# Patient Record
Sex: Female | Born: 1980 | Race: White | Hispanic: No | Marital: Married | Smoking: Never smoker
Health system: Southern US, Community
[De-identification: ages and names within clinical notes are randomized; demographics above are authoritative.]

## PROBLEM LIST (undated history)

## (undated) ENCOUNTER — Inpatient Hospital Stay (HOSPITAL_COMMUNITY): Payer: Self-pay

## (undated) DIAGNOSIS — J45909 Unspecified asthma, uncomplicated: Secondary | ICD-10-CM

## (undated) DIAGNOSIS — F419 Anxiety disorder, unspecified: Secondary | ICD-10-CM

## (undated) HISTORY — DX: Unspecified asthma, uncomplicated: J45.909

## (undated) HISTORY — DX: Anxiety disorder, unspecified: F41.9

## (undated) HISTORY — PX: WISDOM TOOTH EXTRACTION: SHX21

---

## 2017-05-11 ENCOUNTER — Inpatient Hospital Stay (HOSPITAL_COMMUNITY)
Admission: AD | Admit: 2017-05-11 | Discharge: 2017-05-12 | Disposition: A | Discharge status: Home / Self Care | Payer: 59 | Source: Ambulatory Visit | Attending: Obstetrics and Gynecology | Admitting: Obstetrics and Gynecology

## 2017-05-11 ENCOUNTER — Encounter (HOSPITAL_COMMUNITY): Payer: Self-pay | Admitting: *Deleted

## 2017-05-11 DIAGNOSIS — Z6791 Unspecified blood type, Rh negative: Secondary | ICD-10-CM | POA: Insufficient documentation

## 2017-05-11 DIAGNOSIS — O021 Missed abortion: Secondary | ICD-10-CM | POA: Diagnosis not present

## 2017-05-11 DIAGNOSIS — O26891 Other specified pregnancy related conditions, first trimester: Secondary | ICD-10-CM

## 2017-05-11 DIAGNOSIS — Z3A08 8 weeks gestation of pregnancy: Secondary | ICD-10-CM | POA: Insufficient documentation

## 2017-05-11 DIAGNOSIS — O09891 Supervision of other high risk pregnancies, first trimester: Secondary | ICD-10-CM | POA: Diagnosis not present

## 2017-05-11 DIAGNOSIS — R109 Unspecified abdominal pain: Secondary | ICD-10-CM | POA: Diagnosis not present

## 2017-05-11 LAB — CBC
HEMATOCRIT: 30.4 % — AB (ref 36.0–46.0)
HEMOGLOBIN: 10.9 g/dL — AB (ref 12.0–15.0)
MCH: 30.3 pg (ref 26.0–34.0)
MCHC: 35.9 g/dL (ref 30.0–36.0)
MCV: 84.4 fL (ref 78.0–100.0)
Platelets: 178 10*3/uL (ref 150–400)
RBC: 3.6 MIL/uL — AB (ref 3.87–5.11)
RDW: 12 % (ref 11.5–15.5)
WBC: 9.5 10*3/uL (ref 4.0–10.5)

## 2017-05-11 LAB — TYPE AND SCREEN
ABO/RH(D): O NEG
Antibody Screen: NEGATIVE

## 2017-05-11 MED ORDER — SODIUM CHLORIDE 0.9 % IV SOLN
8.0000 mg | Freq: Once | INTRAVENOUS | Status: AC
  Administered 2017-05-11: 8 mg via INTRAVENOUS
  Filled 2017-05-11: qty 4

## 2017-05-11 MED ORDER — RHO D IMMUNE GLOBULIN 1500 UNIT/2ML IJ SOSY
300.0000 ug | PREFILLED_SYRINGE | Freq: Once | INTRAMUSCULAR | Status: AC
  Administered 2017-05-12: 300 ug via INTRAVENOUS
  Filled 2017-05-11: qty 2

## 2017-05-11 MED ORDER — MORPHINE SULFATE (PF) 4 MG/ML IV SOLN
2.0000 mg | INTRAVENOUS | Status: DC | PRN
  Administered 2017-05-11: 2 mg via INTRAVENOUS
  Filled 2017-05-11: qty 1

## 2017-05-11 MED ORDER — LACTATED RINGERS IV SOLN
INTRAVENOUS | Status: DC
  Administered 2017-05-11: 23:00:00 via INTRAVENOUS

## 2017-05-11 MED ORDER — OXYCODONE-ACETAMINOPHEN 5-325 MG PO TABS: 1.0000 | ORAL_TABLET | Freq: Four times a day (QID) | ORAL | Status: DC | PRN

## 2017-05-11 NOTE — MAU Provider Note (Signed)
History     CSN: [id number]  Arrival date and time: 05/11/17 2223   First Provider Initiated Contact with Patient 05/11/17 2250     G1 @[redacted]w[redacted]d  here with LAP. Pain started shortly after 1700. Just prior to that she had taken Cytotec for MAB. She describes pain as bilateral, sharp and intermittent. Rates 8/10. Associated sx are N/V/D. She took Ibuprofen but vomited just after. Reports small amt of VB that started around 8pm.    OB History    Gravida Para Term Preterm AB Living   1         0   SAB TAB Ectopic Multiple Live Births                  Past Medical History:  Diagnosis Date  . Medical history non-contributory     Past Surgical History:  Procedure Laterality Date  . NO PAST SURGERIES      History reviewed. No pertinent family history.  Social History  Substance Use Topics  . Smoking status: Never Smoker  . Smokeless tobacco: Never Used  . Alcohol use No    Allergies: No Known Allergies  Prescriptions Prior to Admission  Medication Sig Dispense Refill Last Dose  . Prenatal Vit-Fe Fumarate-FA (PRENATAL MULTIVITAMIN) TABS tablet Take 1 tablet by mouth daily at 12 noon.   05/11/2017 at Unknown time    Review of Systems  Gastrointestinal: Positive for abdominal pain, diarrhea, nausea and vomiting.  Genitourinary: Positive for vaginal bleeding.   Physical Exam   Blood pressure 108/65, pulse 66, temperature 98 F (36.7 C), resp. rate (!) 22, last menstrual period 03/13/2017.  Physical Exam  Nursing note and vitals reviewed. Constitutional: She is oriented to person, place, and time. She appears well-developed and well-nourished. Distressed: appears uncomfortable at times.  HENT:  Head: Normocephalic and atraumatic.  Neck: Normal range of motion.  Cardiovascular: Normal rate.   Respiratory: Effort normal. No respiratory distress.  GI: Soft. She exhibits no distension and no mass. There is no tenderness. There is no rebound and no guarding.  Genitourinary:   Genitourinary Comments: External: no lesions or erythema Vagina: rugated, pink, moist, small drk red bloody discharge, grey tissue removed from vault with ring forcep, no active bleeding from os Uterus: non enlarged, anteverted, no tender, no CMT    Musculoskeletal: Normal range of motion.  Neurological: She is alert and oriented to person, place, and time.  Skin: Skin is warm and dry.  Psychiatric: She has a normal mood and affect.   Results for orders placed or performed during the hospital encounter of 05/11/17 (from the past 24 hour(s))  CBC     Status: Abnormal   Collection Time: 05/11/17 10:54 PM  Result Value Ref Range   WBC 9.5 4.0 - 10.5 K/uL   RBC 3.60 (L) 3.87 - 5.11 MIL/uL   Hemoglobin 10.9 (L) 12.0 - 15.0 g/dL   HCT 30.4 (L) 36.0 - 46.0 %   MCV 84.4 78.0 - 100.0 fL   MCH 30.3 26.0 - 34.0 pg   MCHC 35.9 30.0 - 36.0 g/dL   RDW 12.0 11.5 - 15.5 %   Platelets 178 150 - 400 K/uL  Type and screen     Status: None   Collection Time: 05/11/17 10:54 PM  Result Value Ref Range   ABO/RH(D) O NEG    Antibody Screen NEG    Sample Expiration 05/14/2017   Rh IG workup (includes ABO/Rh)     Status: None (Preliminary result)  Collection Time: 05/11/17 10:54 PM  Result Value Ref Range   Gestational Age(Wks) 8    ABO/RH(D) O NEG    Unit Number [id number]    Blood Component Type RHIG    Unit division 00    Status of Unit ISSUED    Transfusion Status OK TO TRANSFUSE    MAU Course  Procedures Morphine Zofran Rhogam  MDM Labs ordered and reviewed. Pain and nausea improved after meds. No emesis. Bleeding minimal. Presentation, clinical findings, and plan discussed with Dr. Ouida Sills. Stable for discharge home.  Assessment and Plan   1. Missed abortion   2. Abdominal pain during pregnancy in first trimester   3. Rh negative status during pregnancy in first trimester    Discharge home Follow up in OB office in 1 week Rx Percocet Rx Zofran Bleeding/return  precautions  Allergies as of 05/12/2017   No Known Allergies     Medication List    TAKE these medications   ondansetron 4 MG disintegrating tablet Commonly known as:  ZOFRAN ODT Take 1 tablet (4 mg total) by mouth every 8 (eight) hours as needed for nausea or vomiting.   oxyCODONE-acetaminophen 5-325 MG tablet Commonly known as:  PERCOCET/ROXICET Take 1-2 tablets by mouth every 6 (six) hours as needed for severe pain.   prenatal multivitamin Tabs tablet Take 1 tablet by mouth daily at 12 noon.      Julianne Handler, CNM 05/12/2017, 12:50 AM

## 2017-05-11 NOTE — MAU Note (Addendum)
Pt seen at [facility] today by Dr Carlis Abbott and told had missed AB. Given cytotec po which she completed about 1700. Started shortly thereafter to have abd cramping, vomiting, and scant vag bleeding. Did not know pain would be this bad.

## 2017-05-12 DIAGNOSIS — O021 Missed abortion: Secondary | ICD-10-CM | POA: Diagnosis not present

## 2017-05-12 DIAGNOSIS — O09891 Supervision of other high risk pregnancies, first trimester: Secondary | ICD-10-CM | POA: Diagnosis not present

## 2017-05-12 DIAGNOSIS — R109 Unspecified abdominal pain: Secondary | ICD-10-CM | POA: Diagnosis not present

## 2017-05-12 DIAGNOSIS — Z6791 Unspecified blood type, Rh negative: Secondary | ICD-10-CM | POA: Diagnosis not present

## 2017-05-12 DIAGNOSIS — O26891 Other specified pregnancy related conditions, first trimester: Secondary | ICD-10-CM | POA: Diagnosis not present

## 2017-05-12 MED ORDER — OXYCODONE-ACETAMINOPHEN 5-325 MG PO TABS: 1.0000 | ORAL_TABLET | Freq: Four times a day (QID) | ORAL | 0 refills | Status: DC | PRN

## 2017-05-12 MED ORDER — ONDANSETRON 4 MG PO TBDP: 4.0000 mg | ORAL_TABLET | Freq: Three times a day (TID) | ORAL | 0 refills | Status: DC | PRN

## 2017-05-12 NOTE — Progress Notes (Signed)
Written and verbal d/c instructions given and understanding voiced. 

## 2017-05-12 NOTE — Discharge Instructions (Signed)

## 2017-05-13 LAB — RH IG WORKUP (INCLUDES ABO/RH)
ABO/RH(D): O NEG
Gestational Age(Wks): 8
Unit division: 0

## 2017-05-17 ENCOUNTER — Encounter (HOSPITAL_COMMUNITY): Payer: Self-pay

## 2017-05-18 ENCOUNTER — Encounter (HOSPITAL_COMMUNITY): Payer: Self-pay | Admitting: *Deleted

## 2017-05-18 ENCOUNTER — Encounter (HOSPITAL_COMMUNITY)
Admission: AD | Disposition: A | Discharge status: Home / Self Care | Payer: Self-pay | Source: Ambulatory Visit | Attending: Obstetrics

## 2017-05-18 ENCOUNTER — Ambulatory Visit (HOSPITAL_COMMUNITY): Payer: 59

## 2017-05-18 ENCOUNTER — Ambulatory Visit (HOSPITAL_COMMUNITY)
Admission: AD | Admit: 2017-05-18 | Discharge: 2017-05-18 | Disposition: A | Discharge status: Home / Self Care | Payer: 59 | Source: Ambulatory Visit | Attending: Obstetrics | Admitting: Obstetrics

## 2017-05-18 ENCOUNTER — Ambulatory Visit (HOSPITAL_COMMUNITY): Payer: 59 | Admitting: Anesthesiology

## 2017-05-18 DIAGNOSIS — O020 Blighted ovum and nonhydatidiform mole: Secondary | ICD-10-CM | POA: Insufficient documentation

## 2017-05-18 DIAGNOSIS — Z3A09 9 weeks gestation of pregnancy: Secondary | ICD-10-CM | POA: Diagnosis not present

## 2017-05-18 DIAGNOSIS — R58 Hemorrhage, not elsewhere classified: Secondary | ICD-10-CM

## 2017-05-18 HISTORY — PX: DILATION AND EVACUATION: SHX1459

## 2017-05-18 HISTORY — PX: HYSTEROSCOPY: SHX211

## 2017-05-18 LAB — CBC
HCT: 35.4 % — ABNORMAL LOW (ref 36.0–46.0)
Hemoglobin: 12.2 g/dL (ref 12.0–15.0)
MCH: 29.8 pg (ref 26.0–34.0)
MCHC: 34.5 g/dL (ref 30.0–36.0)
MCV: 86.3 fL (ref 78.0–100.0)
PLATELETS: 196 10*3/uL (ref 150–400)
RBC: 4.1 MIL/uL (ref 3.87–5.11)
RDW: 12.2 % (ref 11.5–15.5)
WBC: 6.4 10*3/uL (ref 4.0–10.5)

## 2017-05-18 LAB — COMPREHENSIVE METABOLIC PANEL
ALT: 19 U/L (ref 14–54)
ANION GAP: 9 (ref 5–15)
AST: 22 U/L (ref 15–41)
Albumin: 4.6 g/dL (ref 3.5–5.0)
Alkaline Phosphatase: 36 U/L — ABNORMAL LOW (ref 38–126)
BUN: 9 mg/dL (ref 6–20)
CHLORIDE: 103 mmol/L (ref 101–111)
CO2: 23 mmol/L (ref 22–32)
Calcium: 9.4 mg/dL (ref 8.9–10.3)
Creatinine, Ser: 0.58 mg/dL (ref 0.44–1.00)
GFR calc non Af Amer: >60 mL/min (ref >60)
Glucose, Bld: 89 mg/dL (ref 65–99)
POTASSIUM: 3.9 mmol/L (ref 3.5–5.1)
SODIUM: 135 mmol/L (ref 135–145)
Total Bilirubin: 1.5 mg/dL — ABNORMAL HIGH (ref 0.3–1.2)
Total Protein: 7.7 g/dL (ref 6.5–8.1)

## 2017-05-18 LAB — PREPARE RBC (CROSSMATCH)

## 2017-05-18 LAB — HCG, QUANTITATIVE, PREGNANCY: hCG, Beta Chain, Quant, S: 427592 m[IU]/mL — ABNORMAL HIGH (ref <5)

## 2017-05-18 SURGERY — DILATION AND EVACUATION, UTERUS
Anesthesia: Monitor Anesthesia Care | Site: Vagina

## 2017-05-18 MED ORDER — SCOPOLAMINE 1 MG/3DAYS TD PT72
1.0000 | MEDICATED_PATCH | Freq: Once | TRANSDERMAL | Status: DC
  Administered 2017-05-18: 1.5 mg via TRANSDERMAL

## 2017-05-18 MED ORDER — DEXAMETHASONE SODIUM PHOSPHATE 10 MG/ML IJ SOLN
INTRAMUSCULAR | Status: AC
  Filled 2017-05-18: qty 1

## 2017-05-18 MED ORDER — IBUPROFEN 800 MG PO TABS: 800.0000 mg | ORAL_TABLET | Freq: Four times a day (QID) | ORAL | 0 refills | Status: DC | PRN

## 2017-05-18 MED ORDER — FENTANYL CITRATE (PF) 100 MCG/2ML IJ SOLN
25.0000 ug | INTRAMUSCULAR | Status: DC | PRN
  Administered 2017-05-18 (×2): 50 ug via INTRAVENOUS

## 2017-05-18 MED ORDER — LIDOCAINE HCL 1 % IJ SOLN
INTRAMUSCULAR | Status: AC
  Filled 2017-05-18: qty 20

## 2017-05-18 MED ORDER — PROPOFOL 10 MG/ML IV BOLUS
INTRAVENOUS | Status: AC
  Filled 2017-05-18: qty 20

## 2017-05-18 MED ORDER — METHYLERGONOVINE MALEATE 0.2 MG PO TABS: 0.2000 mg | ORAL_TABLET | Freq: Four times a day (QID) | ORAL | 0 refills | Status: AC

## 2017-05-18 MED ORDER — LIDOCAINE HCL 1 % IJ SOLN
INTRAMUSCULAR | Status: DC | PRN
  Administered 2017-05-18: 10 mL

## 2017-05-18 MED ORDER — LIDOCAINE HCL (CARDIAC) 20 MG/ML IV SOLN
INTRAVENOUS | Status: AC
  Filled 2017-05-18: qty 5

## 2017-05-18 MED ORDER — MIDAZOLAM HCL 2 MG/2ML IJ SOLN
INTRAMUSCULAR | Status: DC | PRN
  Administered 2017-05-18: 2 mg via INTRAVENOUS

## 2017-05-18 MED ORDER — LIDOCAINE HCL (CARDIAC) 20 MG/ML IV SOLN
INTRAVENOUS | Status: DC | PRN
Start: 2017-05-18 — End: 2017-05-18
  Administered 2017-05-18: 80 mg via INTRAVENOUS

## 2017-05-18 MED ORDER — SCOPOLAMINE 1 MG/3DAYS TD PT72
MEDICATED_PATCH | TRANSDERMAL | Status: AC
  Filled 2017-05-18: qty 1

## 2017-05-18 MED ORDER — FENTANYL CITRATE (PF) 100 MCG/2ML IJ SOLN
INTRAMUSCULAR | Status: DC | PRN
  Administered 2017-05-18: 100 ug via INTRAVENOUS
  Administered 2017-05-18 (×2): 50 ug via INTRAVENOUS

## 2017-05-18 MED ORDER — PROPOFOL 10 MG/ML IV BOLUS
INTRAVENOUS | Status: DC | PRN
  Administered 2017-05-18 (×2): 20 mg via INTRAVENOUS
  Administered 2017-05-18 (×2): 30 mg via INTRAVENOUS
  Administered 2017-05-18: 20 mg via INTRAVENOUS
  Administered 2017-05-18: 10 mg via INTRAVENOUS
  Administered 2017-05-18: 20 mg via INTRAVENOUS
  Administered 2017-05-18: 50 mg via INTRAVENOUS
  Administered 2017-05-18: 10 mg via INTRAVENOUS
  Administered 2017-05-18 (×4): 20 mg via INTRAVENOUS
  Administered 2017-05-18 (×3): 30 mg via INTRAVENOUS

## 2017-05-18 MED ORDER — DEXAMETHASONE SODIUM PHOSPHATE 10 MG/ML IJ SOLN
INTRAMUSCULAR | Status: DC | PRN
  Administered 2017-05-18: 10 mg via INTRAVENOUS

## 2017-05-18 MED ORDER — ONDANSETRON HCL 4 MG/2ML IJ SOLN
INTRAMUSCULAR | Status: AC
  Filled 2017-05-18: qty 2

## 2017-05-18 MED ORDER — ONDANSETRON HCL 4 MG/2ML IJ SOLN
INTRAMUSCULAR | Status: DC | PRN
  Administered 2017-05-18: 4 mg via INTRAVENOUS

## 2017-05-18 MED ORDER — LACTATED RINGERS IV SOLN
INTRAVENOUS | Status: DC
  Administered 2017-05-18 (×2): via INTRAVENOUS

## 2017-05-18 MED ORDER — KETOROLAC TROMETHAMINE 30 MG/ML IJ SOLN
INTRAMUSCULAR | Status: DC | PRN
  Administered 2017-05-18: 30 mg via INTRAVENOUS

## 2017-05-18 MED ORDER — METHYLERGONOVINE MALEATE 0.2 MG/ML IJ SOLN
INTRAMUSCULAR | Status: DC | PRN
  Administered 2017-05-18: 0.2 mg via INTRAMUSCULAR

## 2017-05-18 MED ORDER — LACTATED RINGERS IV SOLN
INTRAVENOUS | Status: DC
  Administered 2017-05-18: 11:00:00 via INTRAVENOUS

## 2017-05-18 MED ORDER — MIDAZOLAM HCL 2 MG/2ML IJ SOLN
INTRAMUSCULAR | Status: AC
  Filled 2017-05-18: qty 2

## 2017-05-18 MED ORDER — FENTANYL CITRATE (PF) 250 MCG/5ML IJ SOLN
INTRAMUSCULAR | Status: AC
  Filled 2017-05-18: qty 5

## 2017-05-18 MED ORDER — PROMETHAZINE HCL 25 MG/ML IJ SOLN: 6.2500 mg | INTRAMUSCULAR | Status: DC | PRN

## 2017-05-18 MED ORDER — FENTANYL CITRATE (PF) 100 MCG/2ML IJ SOLN
INTRAMUSCULAR | Status: AC
  Filled 2017-05-18: qty 2

## 2017-05-18 MED ORDER — KETOROLAC TROMETHAMINE 30 MG/ML IJ SOLN: 30.0000 mg | Freq: Once | INTRAMUSCULAR | Status: DC | PRN

## 2017-05-18 MED ORDER — HYDROCODONE-ACETAMINOPHEN 7.5-325 MG PO TABS: 1.0000 | ORAL_TABLET | Freq: Once | ORAL | Status: DC | PRN

## 2017-05-18 SURGICAL SUPPLY — 24 items
CATH ROBINSON RED A/P 16FR (CATHETERS) ×3 IMPLANT
CLOTH BEACON ORANGE TIMEOUT ST (SAFETY) ×3 IMPLANT
CONT PATH 16OZ SNAP LID 3702 (MISCELLANEOUS) ×3 IMPLANT
DECANTER SPIKE VIAL GLASS SM (MISCELLANEOUS) ×3 IMPLANT
DILATOR CANAL MILEX (MISCELLANEOUS) IMPLANT
GLOVE BIOGEL PI IND STRL 6.5 (GLOVE) ×1 IMPLANT
GLOVE BIOGEL PI IND STRL 7.0 (GLOVE) ×1 IMPLANT
GLOVE BIOGEL PI INDICATOR 6.5 (GLOVE) ×2
GLOVE BIOGEL PI INDICATOR 7.0 (GLOVE) ×2
GLOVE ECLIPSE 6.0 STRL STRAW (GLOVE) ×3 IMPLANT
GOWN STRL REUS W/TWL LRG LVL3 (GOWN DISPOSABLE) ×6 IMPLANT
KIT BERKELEY 1ST TRIMESTER 3/8 (MISCELLANEOUS) ×3 IMPLANT
NS IRRIG 1000ML POUR BTL (IV SOLUTION) ×3 IMPLANT
PACK VAGINAL MINOR WOMEN LF (CUSTOM PROCEDURE TRAY) ×3 IMPLANT
PAD OB MATERNITY 4.3X12.25 (PERSONAL CARE ITEMS) ×3 IMPLANT
PAD PREP 24X48 CUFFED NSTRL (MISCELLANEOUS) ×3 IMPLANT
SET BERKELEY SUCTION TUBING (SUCTIONS) ×3 IMPLANT
TOWEL OR 17X24 6PK STRL BLUE (TOWEL DISPOSABLE) ×6 IMPLANT
TUBING AQUILEX INFLOW (TUBING) ×3 IMPLANT
TUBING AQUILEX OUTFLOW (TUBING) ×3 IMPLANT
VACURETTE 10 RIGID CVD (CANNULA) ×3 IMPLANT
VACURETTE 7MM CVD STRL WRAP (CANNULA) ×3 IMPLANT
VACURETTE 8 RIGID CVD (CANNULA) IMPLANT
VACURETTE 9 RIGID CVD (CANNULA) ×3 IMPLANT

## 2017-05-18 NOTE — Anesthesia Preprocedure Evaluation (Addendum)
Anesthesia Evaluation  Patient identified by MRN, date of birth, ID band Patient awake    Reviewed: Allergy & Precautions, NPO status , Patient's Chart, lab work & pertinent test results  Airway Mallampati: II  TM Distance: >3 FB     Dental   Pulmonary neg pulmonary ROS,    breath sounds clear to auscultation       Cardiovascular negative cardio ROS   Rhythm:Regular Rate:Normal     Neuro/Psych negative neurological ROS     GI/Hepatic negative GI ROS, Neg liver ROS,   Endo/Other  negative endocrine ROS  Renal/GU negative Renal ROS     Musculoskeletal   Abdominal   Peds  Hematology negative hematology ROS (+)   Anesthesia Other Findings   Reproductive/Obstetrics Molar pregnancy for D&E                             Lab Results  Component Value Date   WBC 6.4 05/18/2017   HGB 12.2 05/18/2017   HCT 35.4 (L) 05/18/2017   MCV 86.3 05/18/2017   PLT 196 05/18/2017   Lab Results  Component Value Date   CREATININE 0.58 05/18/2017   BUN 9 05/18/2017   NA 135 05/18/2017   K 3.9 05/18/2017   CL 103 05/18/2017   CO2 23 05/18/2017    Anesthesia Physical Anesthesia Plan  ASA: II  Anesthesia Plan: MAC   Post-op Pain Management:    Induction: Intravenous  PONV Risk Score and Plan: 3 and Ondansetron, Midazolam, Propofol infusion, Scopolamine patch - Pre-op and Treatment may vary due to age or medical condition  Airway Management Planned: Natural Airway and Simple Face Mask  Additional Equipment:   Intra-op Plan:   Post-operative Plan:   Informed Consent: I have reviewed the patients History and Physical, chart, labs and discussed the procedure including the risks, benefits and alternatives for the proposed anesthesia with the patient or authorized representative who has indicated his/her understanding and acceptance.     Plan Discussed with: CRNA  Anesthesia Plan Comments:         Anesthesia Quick Evaluation

## 2017-05-18 NOTE — Anesthesia Postprocedure Evaluation (Signed)
Anesthesia Post Note  Patient: Jasmine Watts  Procedure(s) Performed: Procedure(s) (LRB): DILATATION AND EVACUATION under ULTRASOUND GUIDANCE (N/A) HYSTEROSCOPY (N/A)     Patient location during evaluation: PACU Anesthesia Type: MAC Level of consciousness: awake and alert Pain management: pain level controlled Vital Signs Assessment: post-procedure vital signs reviewed and stable Respiratory status: spontaneous breathing, nonlabored ventilation, respiratory function stable and patient connected to nasal cannula oxygen Cardiovascular status: stable and blood pressure returned to baseline Anesthetic complications: no    Last Vitals:  Vitals:   05/18/17 1500 05/18/17 1515  BP: 100/61 (!) 105/51  Pulse: 62 63  Resp: 13 13  Temp:    SpO2: 100% 100%    Last Pain:  Vitals:   05/18/17 1515  TempSrc:   PainSc: Asleep   Pain Goal: Patients Stated Pain Goal: 3 (05/18/17 1515)               [name]

## 2017-05-18 NOTE — Op Note (Signed)
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    Specimen: products of conception  EBL: 250 cc    After adequate anesthesia was achieved, the patient placed in the dorsal lithotomy position in [place] stirrups.  She was prepped and draped in the usual sterile fashion.  The bivalve speculum was placed in the vagina and the anterior lip of the cervix grasped with a single-tooth tenaculum.   10cc of 1% lidocaine was administered in a paracervical fashion. The cervix was serially dilated under ultrasound guidance to 24F.  The 10 mm curette did not easily navigate the internal cervical os, so a 9 mm suction curette was advanced to the fundus, the vacuum was engaged, and multiple suction passes were performed until the products of conception were evacuated.  A Sharp curettage was performed and a gritty texture was noted in all four quadrants.  However, ultrasound noted a small pocket of what appeared to be continued products of conception at the left fundus.  This area of the uterus was unable to be accessed with either the suction or sharp curettage.   Assistance was requested and Dr. Harrington Challenger scrubbed for the remainder of the case.  She also made multiple attempts under ultrasound guidance to access the area of suspected products, but was unable to do so.   At this time, ultrasound showed no free fluid in the pelvis. Decision was made to proceed with hysteroscopy.  The hysteroscope was passed through the cervix under direct visualization.  The cervical canal was normal.  There was no false passage.  The endometrium was surveyed and was normal.  The cavity appeared to have a normal shape.  There was no  uterine perforation seen.  There was no area of possible products of conception.  There was no evidence of connection to a separate uterine horn.  However, under ultrasound guidance, the hysteroscope was never able to advance to the area at the left fundus that corresponded to the suspected products.  At this time, the decision was made to end the procedure.  A bimanual massage was performed and confirmed good uterine tone.  A dose of 0.2mg  of IM methergine was given for bleeding prophylaxis. All instruments were removed from the vagina.  Pressure was used to achieve hemostasis at the tenaculum site. The patient tolerated the procedure well was brought to the recovery room in stable condition for the procedure. All sponge and needle counts correct x2.  The patient will follow up in 1 week for repeat quantitative beta hcg and ultrasound.    [place], [facility]

## 2017-05-18 NOTE — Progress Notes (Signed)
Patient Has large bruise on her left arm/elbow area.  Patient states the bruise if from "down on floor, beating arm on floor from pain.  Patient also has a 2x2 taped to her lower back from where dermatologist removal of skin tag.  2x2 covered with paper tape, clean, dry and intact.

## 2017-05-18 NOTE — Discharge Instructions (Addendum)
Pelvic rest x 2 weeks (no intercourse or tampons).  No tub baths or swimming for two weeks.    Do not drive until you are not taking narcotic pain medication.   Call your doctor if you have heavy vaginal bleeding (soaking through a pad an hour or more for >2 hours in a row), temperature >101F, severe nausea, vomiting, severe or worsening abdominal pain, dizziness, shortness of breath, chest pain or any other concerns.  Please take motrin every 6 hours.  Add percocet if your pain is not controlled by motrin alone. You have been prescribed an methergine to help with bleeding.  Please take the first dose tonight at 7pm and an additional dose every 6 hours for 4 doses.   Post Anesthesia Home Care Instructions NO IBUPROFEN PRODUCTS UNTIL: 7:45 PM TONIGHT  Activity: Get plenty of rest for the remainder of the day. A responsible individual must stay with you for 24 hours following the procedure.  For the next 24 hours, DO NOT: -Drive a car -Paediatric nurse -Drink alcoholic beverages -Take any medication unless instructed by your physician -Make any legal decisions or sign important papers.  Meals: Start with liquid foods such as gelatin or soup. Progress to regular foods as tolerated. Avoid greasy, spicy, heavy foods. If nausea and/or vomiting occur, drink only clear liquids until the nausea and/or vomiting subsides. Call your physician if vomiting continues.  Special Instructions/Symptoms: Your throat may feel dry or sore from the anesthesia or the breathing tube placed in your throat during surgery. If this causes discomfort, gargle with warm salt water. The discomfort should disappear within 24 hours.  If you had a scopolamine patch placed behind your ear for the management of post- operative nausea and/or vomiting:  1. The medication in the patch is effective for 72 hours, after which it should be removed.  Wrap patch in a tissue and discard in the trash. Wash hands thoroughly with soap  and water. 2. You may remove the patch earlier than 72 hours if you experience unpleasant side effects which may include dry mouth, dizziness or visual disturbances. 3. Avoid touching the patch. Wash your hands with soap and water after contact with the patch.

## 2017-05-18 NOTE — H&P (Signed)
36 y.o. G1P0 @ [redacted]w[redacted]d by LMP presents for [facility] for molar pregnancy.  She had an an early failed pregnancy (GS, possible yolk sac but no development of fetal pole.).  She elected for medical management and took 800ug of cytotec on 8/17.  She was seen in MAU on 8/18 for pain and bleeding.  She was noted on exam to have POC at the external cervical os.  This was collected and sent for pathology and results returned complete molar pregnancy.  She was seen in the office yesterday and noted to have a thickened endometrial stripe at 2.8 cm consistent with retained POC.  The most recent US had the characteristic appearance of a molar pregnancy.    Past Medical History:  Diagnosis Date  . Medical history non-contributory     Past Surgical History:  Procedure Laterality Date  . NO PAST SURGERIES      OB History  Gravida Para Term Preterm AB Living  1         0  SAB TAB Ectopic Multiple Live Births               # Outcome Date GA Lbr Len/2nd Weight Sex Delivery Anes PTL Lv  1 Current               Social History   Social History  . Marital status: Married    Spouse name: N/A  . Number of children: N/A  . Years of education: N/A   Occupational History  . Not on file.   Social History Main Topics  . Smoking status: Never Smoker  . Smokeless tobacco: Never Used  . Alcohol use No  . Drug use: No  . Sexual activity: Yes   Other Topics Concern  . Not on file   Social History Narrative  . No narrative on file   Patient has no known allergies.   Other PNC: uncomplicated.  ABO, Rh: --/--/O NEG, O NEG (08/17 2254) Antibody: NEG (08/17 2254)   General:  NAD Abdomen:  soft Ex:  no edema FHTs:  Absent   A/P   36 y.o.  G1P0  [redacted]w[redacted]d presents for dilation and curettage for suspected molar pregnancy.    Plan for surgical management.  Discussed risks to include infection, increased risk of bleeding, damage to surrounding structures (including but not limited to vagina, cervix, bladder,  uterus), uterine perforation, need for additional procedures.  Given molar pregnancy, will get preop quant bhcg, cbc, cmp, cxr.  Will have 2 IVs and T&C for 2 units.  Discussed need to follow quants to 0 and for 6 months.  Discussed risks of GTD/GTN.  All questions answered and patient elects to proceed.  Rh: Neg--pt received rhogam on 8/17.  [place]

## 2017-05-18 NOTE — Transfer of Care (Signed)
Immediate Anesthesia Transfer of Care Note  Patient: Jasmine Watts  Procedure(s) Performed: Procedure(s): DILATATION AND EVACUATION under ULTRASOUND GUIDANCE (N/A) HYSTEROSCOPY (N/A)  Patient Location: PACU  Anesthesia Type:MAC  Level of Consciousness: sedated  Airway & Oxygen Therapy: Patient Spontanous Breathing and Patient connected to nasal cannula oxygen  Post-op Assessment: Report given to RN  Post vital signs: Reviewed and stable  Last Vitals:  Vitals:   05/18/17 1022  BP: (!) 135/59  Pulse: 77  Resp: 18  Temp: 37.1 C  SpO2: 100%    Last Pain:  Vitals:   05/18/17 1022  TempSrc: Oral      Patients Stated Pain Goal: 3 (09/81/19 1478)  Complications: No apparent anesthesia complications

## 2017-05-19 ENCOUNTER — Encounter (HOSPITAL_COMMUNITY): Payer: Self-pay | Admitting: Obstetrics

## 2017-05-22 LAB — TYPE AND SCREEN
ABO/RH(D): O NEG
Antibody Screen: POSITIVE
DAT, IGG: NEGATIVE
UNIT DIVISION: 0
Unit division: 0

## 2017-05-22 LAB — BPAM RBC
Blood Product Expiration Date: 201809252359
Blood Product Expiration Date: 201809272359
UNIT TYPE AND RH: 9500
UNIT TYPE AND RH: 9500

## 2017-07-19 ENCOUNTER — Encounter (HOSPITAL_COMMUNITY): Payer: Self-pay | Admitting: *Deleted

## 2017-07-23 ENCOUNTER — Ambulatory Visit (HOSPITAL_COMMUNITY)
Admission: RE | Admit: 2017-07-23 | Discharge: 2017-07-23 | Disposition: A | Discharge status: Home / Self Care | Payer: 59 | Source: Ambulatory Visit | Attending: Obstetrics | Admitting: Obstetrics

## 2017-07-23 ENCOUNTER — Encounter (HOSPITAL_COMMUNITY)
Admission: RE | Disposition: A | Discharge status: Home / Self Care | Payer: Self-pay | Source: Ambulatory Visit | Attending: Obstetrics

## 2017-07-23 ENCOUNTER — Encounter (HOSPITAL_COMMUNITY): Payer: Self-pay

## 2017-07-23 ENCOUNTER — Ambulatory Visit (HOSPITAL_COMMUNITY): Payer: 59 | Admitting: Anesthesiology

## 2017-07-23 ENCOUNTER — Ambulatory Visit (HOSPITAL_COMMUNITY): Payer: 59

## 2017-07-23 DIAGNOSIS — Z3A00 Weeks of gestation of pregnancy not specified: Secondary | ICD-10-CM | POA: Diagnosis not present

## 2017-07-23 DIAGNOSIS — O020 Blighted ovum and nonhydatidiform mole: Secondary | ICD-10-CM | POA: Insufficient documentation

## 2017-07-23 DIAGNOSIS — R58 Hemorrhage, not elsewhere classified: Secondary | ICD-10-CM

## 2017-07-23 DIAGNOSIS — Z791 Long term (current) use of non-steroidal anti-inflammatories (NSAID): Secondary | ICD-10-CM | POA: Diagnosis not present

## 2017-07-23 HISTORY — PX: DILATION AND EVACUATION: SHX1459

## 2017-07-23 LAB — CBC
HCT: 39 % (ref 36.0–46.0)
Hemoglobin: 13 g/dL (ref 12.0–15.0)
MCH: 29.7 pg (ref 26.0–34.0)
MCHC: 33.3 g/dL (ref 30.0–36.0)
MCV: 89 fL (ref 78.0–100.0)
PLATELETS: 214 10*3/uL (ref 150–400)
RBC: 4.38 MIL/uL (ref 3.87–5.11)
RDW: 13.3 % (ref 11.5–15.5)
WBC: 5.5 10*3/uL (ref 4.0–10.5)

## 2017-07-23 LAB — HCG, QUANTITATIVE, PREGNANCY: HCG, BETA CHAIN, QUANT, S: 292 m[IU]/mL — AB (ref <5)

## 2017-07-23 SURGERY — DILATION AND EVACUATION, UTERUS
Anesthesia: Monitor Anesthesia Care | Site: Vagina

## 2017-07-23 MED ORDER — LACTATED RINGERS IV SOLN
INTRAVENOUS | Status: DC
  Administered 2017-07-23: 125 mL/h via INTRAVENOUS

## 2017-07-23 MED ORDER — PROPOFOL 500 MG/50ML IV EMUL
INTRAVENOUS | Status: DC | PRN
  Administered 2017-07-23: 100 ug/kg/min via INTRAVENOUS

## 2017-07-23 MED ORDER — ONDANSETRON HCL 4 MG/2ML IJ SOLN
INTRAMUSCULAR | Status: AC
  Filled 2017-07-23: qty 2

## 2017-07-23 MED ORDER — LIDOCAINE HCL 1 % IJ SOLN
INTRAMUSCULAR | Status: AC
  Filled 2017-07-23: qty 1

## 2017-07-23 MED ORDER — MEPERIDINE HCL 25 MG/ML IJ SOLN: 6.2500 mg | INTRAMUSCULAR | Status: DC | PRN

## 2017-07-23 MED ORDER — DOXYCYCLINE HYCLATE 100 MG IV SOLR
200.0000 mg | Freq: Once | INTRAVENOUS | Status: AC
  Administered 2017-07-23: 200 mg via INTRAVENOUS
  Filled 2017-07-23: qty 200

## 2017-07-23 MED ORDER — MIDAZOLAM HCL 2 MG/2ML IJ SOLN: 0.5000 mg | Freq: Once | INTRAMUSCULAR | Status: DC | PRN

## 2017-07-23 MED ORDER — DEXAMETHASONE SODIUM PHOSPHATE 10 MG/ML IJ SOLN
INTRAMUSCULAR | Status: AC
  Filled 2017-07-23: qty 1

## 2017-07-23 MED ORDER — LIDOCAINE HCL (CARDIAC) 20 MG/ML IV SOLN
INTRAVENOUS | Status: AC
  Filled 2017-07-23: qty 5

## 2017-07-23 MED ORDER — LIDOCAINE HCL (CARDIAC) 20 MG/ML IV SOLN
INTRAVENOUS | Status: DC | PRN
  Administered 2017-07-23: 40 mg via INTRAVENOUS

## 2017-07-23 MED ORDER — ONDANSETRON HCL 4 MG/2ML IJ SOLN
INTRAMUSCULAR | Status: DC | PRN
  Administered 2017-07-23: 4 mg via INTRAVENOUS

## 2017-07-23 MED ORDER — MIDAZOLAM HCL 2 MG/2ML IJ SOLN
INTRAMUSCULAR | Status: AC
  Filled 2017-07-23: qty 2

## 2017-07-23 MED ORDER — FENTANYL CITRATE (PF) 100 MCG/2ML IJ SOLN
INTRAMUSCULAR | Status: AC
  Filled 2017-07-23: qty 2

## 2017-07-23 MED ORDER — PROPOFOL 10 MG/ML IV BOLUS
INTRAVENOUS | Status: AC
  Filled 2017-07-23: qty 20

## 2017-07-23 MED ORDER — KETOROLAC TROMETHAMINE 30 MG/ML IJ SOLN
INTRAMUSCULAR | Status: DC | PRN
  Administered 2017-07-23: 30 mg via INTRAVENOUS

## 2017-07-23 MED ORDER — FENTANYL CITRATE (PF) 100 MCG/2ML IJ SOLN: 25.0000 ug | INTRAMUSCULAR | Status: DC | PRN

## 2017-07-23 MED ORDER — PROMETHAZINE HCL 25 MG/ML IJ SOLN: 6.2500 mg | INTRAMUSCULAR | Status: DC | PRN

## 2017-07-23 MED ORDER — FENTANYL CITRATE (PF) 100 MCG/2ML IJ SOLN
INTRAMUSCULAR | Status: DC | PRN
  Administered 2017-07-23 (×2): 50 ug via INTRAVENOUS

## 2017-07-23 MED ORDER — LIDOCAINE HCL 1 % IJ SOLN
INTRAMUSCULAR | Status: DC | PRN
  Administered 2017-07-23: 10 mL

## 2017-07-23 MED ORDER — SCOPOLAMINE 1 MG/3DAYS TD PT72
MEDICATED_PATCH | TRANSDERMAL | Status: AC
  Administered 2017-07-23: 1.5 mg via TRANSDERMAL
  Filled 2017-07-23: qty 1

## 2017-07-23 MED ORDER — GLYCOPYRROLATE 0.2 MG/ML IJ SOLN
INTRAMUSCULAR | Status: AC
  Filled 2017-07-23: qty 1

## 2017-07-23 MED ORDER — LACTATED RINGERS IV SOLN: INTRAVENOUS | Status: DC

## 2017-07-23 MED ORDER — DEXAMETHASONE SODIUM PHOSPHATE 10 MG/ML IJ SOLN
INTRAMUSCULAR | Status: DC | PRN
  Administered 2017-07-23: 5 mg via INTRAVENOUS

## 2017-07-23 MED ORDER — PROPOFOL 10 MG/ML IV BOLUS
INTRAVENOUS | Status: DC | PRN
  Administered 2017-07-23: 50 mg via INTRAVENOUS

## 2017-07-23 MED ORDER — MIDAZOLAM HCL 2 MG/2ML IJ SOLN
INTRAMUSCULAR | Status: DC | PRN
  Administered 2017-07-23: 2 mg via INTRAVENOUS

## 2017-07-23 MED ORDER — SCOPOLAMINE 1 MG/3DAYS TD PT72
1.0000 | MEDICATED_PATCH | Freq: Once | TRANSDERMAL | Status: DC
  Administered 2017-07-23: 1.5 mg via TRANSDERMAL

## 2017-07-23 MED ORDER — PROPOFOL 500 MG/50ML IV EMUL: INTRAVENOUS | Status: DC | PRN

## 2017-07-23 SURGICAL SUPPLY — 23 items
CATH ROBINSON RED A/P 16FR (CATHETERS) ×6 IMPLANT
CONT PATH 16OZ SNAP LID 3702 (MISCELLANEOUS) IMPLANT
DECANTER SPIKE VIAL GLASS SM (MISCELLANEOUS) ×3 IMPLANT
DILATOR CANAL MILEX (MISCELLANEOUS) ×3 IMPLANT
GLOVE BIOGEL PI IND STRL 6.5 (GLOVE) ×1 IMPLANT
GLOVE BIOGEL PI IND STRL 7.0 (GLOVE) ×1 IMPLANT
GLOVE BIOGEL PI INDICATOR 6.5 (GLOVE) ×2
GLOVE BIOGEL PI INDICATOR 7.0 (GLOVE) ×2
GLOVE ECLIPSE 6.0 STRL STRAW (GLOVE) ×3 IMPLANT
GOWN STRL REUS W/TWL LRG LVL3 (GOWN DISPOSABLE) ×6 IMPLANT
KIT BERKELEY 1ST TRIMESTER 3/8 (MISCELLANEOUS) ×3 IMPLANT
NS IRRIG 1000ML POUR BTL (IV SOLUTION) ×3 IMPLANT
PACK VAGINAL MINOR WOMEN LF (CUSTOM PROCEDURE TRAY) ×3 IMPLANT
PAD OB MATERNITY 4.3X12.25 (PERSONAL CARE ITEMS) ×3 IMPLANT
PAD PREP 24X48 CUFFED NSTRL (MISCELLANEOUS) ×3 IMPLANT
SET BERKELEY SUCTION TUBING (SUCTIONS) ×3 IMPLANT
SYR BULB IRRIGATION 50ML (SYRINGE) ×3 IMPLANT
TOWEL OR 17X24 6PK STRL BLUE (TOWEL DISPOSABLE) ×6 IMPLANT
VACURETTE 10 RIGID CVD (CANNULA) IMPLANT
VACURETTE 6 ASPIR F TIP BERK (CANNULA) ×3 IMPLANT
VACURETTE 7MM CVD STRL WRAP (CANNULA) ×3 IMPLANT
VACURETTE 8 RIGID CVD (CANNULA) IMPLANT
VACURETTE 9 RIGID CVD (CANNULA) IMPLANT

## 2017-07-23 NOTE — H&P (Signed)
36 y.o. G1P0010 presents for repeat D&C for complete molar pregnancy.  She had a D&C on 8/24 for complete molar pregnancy under US guidance.  Initial bhcg was 427k.  Intraoperatively, there was an area at the fundus that was concerning for retained products, but this area could not be accessed with the suction.  Hysteroscopy was performed and no evidence of retained products could be visualized.  Following the procedure, her bhcg rapidly dropped to < 1000.  They trended downward until the last few weeks, when the bhcg levels hit a plateau, and more recently increased from 344 to 391.  In consultation with gyn oncology, Dr. Denman George, then plan was made for a repeat D&C.    Past Medical History:  Diagnosis Date  . Medical history non-contributory     Past Surgical History:  Procedure Laterality Date  . DILATION AND EVACUATION N/A 05/18/2017   Procedure: DILATATION AND EVACUATION under ULTRASOUND GUIDANCE;  Surgeon: Jerelyn Charles, MD;  Location: [facility];  Service: Gynecology;  Laterality: N/A;  . HYSTEROSCOPY N/A 05/18/2017   Procedure: HYSTEROSCOPY;  Surgeon: Jerelyn Charles, MD;  Location: [facility];  Service: Gynecology;  Laterality: N/A;  . WISDOM TOOTH EXTRACTION      OB History  Gravida Para Term Preterm AB Living  1         0  SAB TAB Ectopic Multiple Live Births               # Outcome Date GA Lbr Len/2nd Weight Sex Delivery Anes PTL Lv  1 Current               Social History   Social History  . Marital status: Married    Spouse name: N/A  . Number of children: N/A  . Years of education: N/A   Occupational History  . Not on file.   Social History Main Topics  . Smoking status: Never Smoker  . Smokeless tobacco: Never Used  . Alcohol use 6.0 oz/week    10 Glasses of wine per week  . Drug use: No  . Sexual activity: Yes    Birth control/ protection: None     Comment: retained products after D&E in 04/2017   Other Topics Concern  . Not on file   Social History Narrative  . No  narrative on file   Patient has no known allergies.   Other PNC: uncomplicated.  ABO, Rh: --/--/O NEG (08/24 1007) Antibody: POS (08/24 1007)  Vitals:   07/23/17 0736  BP: 103/68  Pulse: 71  Resp: 16  Temp: 98.8 F (37.1 C)  SpO2: 98%     General:  NAD Abdomen:  soft     A/P   36 y.o.  G1P0010 presents for repeat dilation and curettage for complete molar pregnancy with rising bhg following initial evacuation Case discussed wtih Dr. Denman George.  She recommended repeat D&C as this has been shown to decrease the need for chemotherapy for post-molar GTN.    Discussed risks to include infection, bleeding, damage to surrounding structures (including but not limited to vagina, cervix, bladder, uterus), uterine perforation, need for additional procedures.  All questions answered and patient elects to proceed.   Plan T&C, cbc.  Will check bhcg now to follow quants after the procedure  [place]

## 2017-07-23 NOTE — Transfer of Care (Signed)
Immediate Anesthesia Transfer of Care Note  Patient: Jasmine Watts  Procedure(s) Performed: DILATATION AND EVACUATION (N/A Vagina )  Patient Location: PACU  Anesthesia Type:MAC  Level of Consciousness: awake, alert  and oriented  Airway & Oxygen Therapy: Patient Spontanous Breathing and Patient connected to face mask oxygen  Post-op Assessment: Report given to RN, Post -op Vital signs reviewed and stable and Patient moving all extremities X 4  Post vital signs: Reviewed and stable  Last Vitals:  Vitals:   07/23/17 0736  BP: 103/68  Pulse: 71  Resp: 16  Temp: 37.1 C  SpO2: 98%    Last Pain:  Vitals:   07/23/17 0736  TempSrc: Oral      Patients Stated Pain Goal: 3 (73/42/87 6811)  Complications: No apparent anesthesia complications

## 2017-07-23 NOTE — Discharge Instructions (Signed)
NO IBUPROFEN UNTIL 3:30 PM TODAY!  DISCHARGE INSTRUCTIONS: D&C / D&E   The following instructions have been prepared to help you care for yourself upon your return home.   Personal hygiene:  Use sanitary pads for vaginal drainage, not tampons.  Shower the day after your procedure.  NO tub baths, pools or Jacuzzis for 2-3 weeks.  Wipe front to back after using the bathroom.  Activity and limitations:  Do NOT drive or operate any equipment for 24 hours. The effects of anesthesia are still present and drowsiness may result.  Do NOT rest in bed all day.  Walking is encouraged.  Walk up and down stairs slowly.  You may resume your normal activity in one to two days or as indicated by your physician.  Sexual activity: NO intercourse for at least 2 weeks after the procedure, or as indicated by your physician.  Diet: Eat a light meal as desired this evening. You may resume your usual diet tomorrow.  Return to work: You may resume your work activities in one to two days or as indicated by your doctor.  What to expect after your surgery: Expect to have vaginal bleeding/discharge for 2-3 days and spotting for up to 10 days. It is not unusual to have soreness for up to 1-2 weeks. You may have a slight burning sensation when you urinate for the first day. Mild cramps may continue for a couple of days. You may have a regular period in 2-6 weeks.  Call your doctor for any of the following:  Excessive vaginal bleeding, saturating and changing one pad every hour.  Inability to urinate 6 hours after discharge from hospital.  Pain not relieved by pain medication.  Fever of 100.4 F or greater.  Unusual vaginal discharge or odor.    Post Anesthesia Home Care Instructions  Activity: Get plenty of rest for the remainder of the day. A responsible individual must stay with you for 24 hours following the procedure.  For the next 24 hours, DO NOT: -Drive a car -Conservation officer, nature -Drink alcoholic beverages -Take any medication unless instructed by your physician -Make any legal decisions or sign important papers.  Meals: Start with liquid foods such as gelatin or soup. Progress to regular foods as tolerated. Avoid greasy, spicy, heavy foods. If nausea and/or vomiting occur, drink only clear liquids until the nausea and/or vomiting subsides. Call your physician if vomiting continues.  Special Instructions/Symptoms: Your throat may feel dry or sore from the anesthesia or the breathing tube placed in your throat during surgery. If this causes discomfort, gargle with warm salt water. The discomfort should disappear within 24 hours.  If you had a scopolamine patch placed behind your ear for the management of post- operative nausea and/or vomiting:  1. The medication in the patch is effective for 72 hours, after which it should be removed.  Wrap patch in a tissue and discard in the trash. Wash hands thoroughly with soap and water. 2. You may remove the patch earlier than 72 hours if you experience unpleasant side effects which may include dry mouth, dizziness or visual disturbances. 3. Avoid touching the patch. Wash your hands with soap and water after contact with the patch.

## 2017-07-23 NOTE — Anesthesia Preprocedure Evaluation (Addendum)
Anesthesia Evaluation  Patient identified by MRN, date of birth, ID band Patient awake    Reviewed: Allergy & Precautions, NPO status , Patient's Chart, lab work & pertinent test results  History of Anesthesia Complications Negative for: history of anesthetic complications  Airway Mallampati: I  TM Distance: >3 FB Neck ROM: Full    Dental  (+) Dental Advisory Given   Pulmonary neg pulmonary ROS,    breath sounds clear to auscultation       Cardiovascular negative cardio ROS   Rhythm:Regular Rate:Normal     Neuro/Psych negative neurological ROS     GI/Hepatic negative GI ROS, Neg liver ROS,   Endo/Other  negative endocrine ROS  Renal/GU negative Renal ROS     Musculoskeletal   Abdominal   Peds  Hematology negative hematology ROS (+)   Anesthesia Other Findings   Reproductive/Obstetrics (+) Pregnancy (molar pregnancy)                            Anesthesia Physical Anesthesia Plan  ASA: II  Anesthesia Plan: MAC   Post-op Pain Management:    Induction: Intravenous  PONV Risk Score and Plan: 2 and Ondansetron, Dexamethasone, Scopolamine patch - Pre-op, Treatment may vary due to age or medical condition and Propofol infusion  Airway Management Planned: Natural Airway and Simple Face Mask  Additional Equipment:   Intra-op Plan:   Post-operative Plan:   Informed Consent: I have reviewed the patients History and Physical, chart, labs and discussed the procedure including the risks, benefits and alternatives for the proposed anesthesia with the patient or authorized representative who has indicated his/her understanding and acceptance.   Dental advisory given  Plan Discussed with: CRNA and Surgeon  Anesthesia Plan Comments: (Plan routine monitors, MAC)        Anesthesia Quick Evaluation

## 2017-07-23 NOTE — Brief Op Note (Signed)
07/23/2017  9:36 AM  PATIENT:  Jasmine Watts  36 y.o. female  PRE-OPERATIVE DIAGNOSIS:  complete molar pregnancy with rising beta HCg After initial evacuation  POST-OPERATIVE DIAGNOSIS:  complete molar pregnancy with rising beta HCG levels after initial evacuation  PROCEDURE:  Procedure(s) with comments: DILATATION AND EVACUATION (N/A) - ultrasound guidance   SURGEON:  Surgeon(s) and Role:    Jerelyn Charles, MD - Primary  ANESTHESIA:   MAC  EBL:  5 mL   BLOOD ADMINISTERED:none  DRAINS: none   LOCAL MEDICATIONS USED:  LIDOCAINE  and Amount: 10 ml  SPECIMEN:  Source of Specimen:  products of conception  DISPOSITION OF SPECIMEN:  PATHOLOGY  COUNTS:  YES  TOURNIQUET:  * No tourniquets in log *  DICTATION: .Note written in EPIC  PLAN OF CARE: Discharge to home after PACU  PATIENT DISPOSITION:  PACU - hemodynamically stable.   Delay start of Pharmacological VTE agent (>24hrs) due to surgical blood loss or risk of bleeding: not applicable

## 2017-07-23 NOTE — Anesthesia Postprocedure Evaluation (Signed)
Anesthesia Post Note  Patient: Monee Dembeck  Procedure(s) Performed: DILATATION AND EVACUATION (N/A Vagina )     Patient location during evaluation: PACU Anesthesia Type: MAC Level of consciousness: awake and alert, patient cooperative and oriented Pain management: pain level controlled Vital Signs Assessment: post-procedure vital signs reviewed and stable Respiratory status: nonlabored ventilation, spontaneous breathing and respiratory function stable Cardiovascular status: blood pressure returned to baseline and stable Postop Assessment: no apparent nausea or vomiting Anesthetic complications: no    Last Vitals:  Vitals:   07/23/17 1030 07/23/17 1038  BP: 103/66   Pulse: 66 77  Resp: 15 17  Temp:  37.1 C  SpO2: 98% 99%    Last Pain:  Vitals:   07/23/17 1030  TempSrc:   PainSc: 2    Pain Goal: Patients Stated Pain Goal: 3 (07/23/17 1030)               [place],E. Jannie Doyle

## 2017-07-23 NOTE — OR Nursing (Signed)
Dr. Carlis Abbott filled the bladder with 155mls Nacl per red robinson catheter and asepto for U/S guidance

## 2017-07-23 NOTE — Op Note (Signed)
PRE-OPERATIVE DIAGNOSIS:  complete molar pregnancy with rising beta HCg After initial evacuation  POST-OPERATIVE DIAGNOSIS:  complete molar pregnancy with rising beta HCG levels after initial evacuation  PROCEDURE:  Procedure(s) with comments: DILATATION AND EVACUATION (N/A) - ultrasound guidance   SURGEON:  Surgeon(s) and Role:    Jerelyn Charles, MD - Primary  ANESTHESIA:   MAC  EBL:  5 mL   Operative Findings: Thin homogeneous endometrial stripe.  The area of possible retained products at the fundus that was noted after the initial D&C was not identified.  Scant tissue obtained from curettage  Specimen: products of conception     After adequate anesthesia was achieved, the patient placed in the dorsal lithotomy position in [place].  She was prepped and draped in the usual sterile fashion.  The bladder was backfilled with 120 cc sterile normal saline to aid in ultrasound visualization of the uterus.  The bivalve speculum was placed in the vagina and the anterior lip of the cervix grasped with a single-tooth tenaculum.   10 cc of 1% lidocaine was administered in a paracervical fashion. The cervix was serially dilated with Hank dilators.  Ultrasound revealed a homogeneous and thin endometrial stripe.  A 6 suction curette was advanced to the fundus, the vacuum was engaged, and two suction passes were performed. A Sharp curettage was performed and a gritty texture was noted. A final suction pass was performed with minimal results. This completed the procedure. A bimanual massage was performed and confirmed good uterine tone.  All instruments were removed from the vagina.  Pressure was used to achieve hemostasis at the tenaculum site. The bladder was drained with a foley catheter.  A transvaginal ultrasound was performed which did not show any evidence of retained products of conception. The patient tolerated the procedure well was brought to the recovery room in stable condition for the  procedure. All sponge and needle counts correct x2.   [place], [facility]

## 2017-07-24 ENCOUNTER — Encounter (HOSPITAL_COMMUNITY): Payer: Self-pay | Admitting: Obstetrics

## 2017-07-27 LAB — TYPE AND SCREEN
ABO/RH(D): O NEG
Antibody Screen: POSITIVE
UNIT DIVISION: 0
Unit division: 0

## 2017-07-27 LAB — BPAM RBC
BLOOD PRODUCT EXPIRATION DATE: 201811252359
BLOOD PRODUCT EXPIRATION DATE: 201811292359
UNIT TYPE AND RH: 9500
Unit Type and Rh: 9500

## 2017-08-20 ENCOUNTER — Telehealth: Payer: Self-pay | Admitting: *Deleted

## 2017-08-20 NOTE — Telephone Encounter (Signed)
Patient called and left a message regarding the cost of her visit out of pocket.  I called her back and told her to contact her insurance.

## 2017-08-29 ENCOUNTER — Ambulatory Visit: Payer: 59 | Attending: Gynecologic Oncology | Admitting: Gynecologic Oncology

## 2017-08-29 ENCOUNTER — Encounter: Payer: Self-pay | Admitting: Gynecologic Oncology

## 2017-08-29 ENCOUNTER — Other Ambulatory Visit (HOSPITAL_BASED_OUTPATIENT_CLINIC_OR_DEPARTMENT_OTHER): Payer: 59

## 2017-08-29 VITALS — BP 114/70 | HR 78 | Temp 97.8°F | Resp 18 | Ht 65.0 in | Wt 127.5 lb

## 2017-08-29 DIAGNOSIS — O019 Hydatidiform mole, unspecified: Secondary | ICD-10-CM | POA: Diagnosis not present

## 2017-08-29 DIAGNOSIS — R871 Abnormal level of hormones in specimens from female genital organs: Secondary | ICD-10-CM

## 2017-08-29 DIAGNOSIS — E349 Endocrine disorder, unspecified: Secondary | ICD-10-CM

## 2017-08-29 DIAGNOSIS — D4959 Neoplasm of unspecified behavior of other genitourinary organ: Secondary | ICD-10-CM

## 2017-08-29 DIAGNOSIS — O020 Blighted ovum and nonhydatidiform mole: Secondary | ICD-10-CM | POA: Insufficient documentation

## 2017-08-29 DIAGNOSIS — O010 Classical hydatidiform mole: Secondary | ICD-10-CM

## 2017-08-29 NOTE — Progress Notes (Signed)
Consult Note: Gyn-Onc  Consult was requested by Dr. Jerelyn Charles for the evaluation of Jasmine Watts 36 y.o. female  CC:  Chief Complaint  Patient presents with  . Molar pregnancy  . Elevated serum hCG    Assessment/Plan:  Jasmine Watts  is a 36 y.o.  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  Jasmine Watts has an hCG which is very slowly falling after repeat D&C for molar pregnancy.  Her hCG measures do not technically qualify for the definition of plateaued however given the slow decline it is reasonable to anticipate they will stall out in the near future. At that point given that she is already had a repeat D&C we would recommend chemotherapy for management of this.  I am recommending that she proceed with repeating her hCG levels this week at both our office to serve as a baseline for the future as well as at Dr. Ainsley Spinner office where she has registered prior levels for better comparison.  If this shows continued decreased by at least 10% and the value of the hCG then we can continue to monitor her hCG levels and avoid chemotherapy.  However if she is demonstrating a plateau and values over the next several weeks of weekly values we should initiate chemotherapy.  In the interim we will stage her with CT scan of the chest abdomen and pelvis to evaluate for metastatic disease to the lungs liver or other organs.  There is no apparent vaginal disease on examination.  If the CT scan is negative and her hCG plateaus she would be considered low risk nonmetastatic by WHO criteria and therefore would be a candidate for single agent chemotherapy with either methotrexate or actinomycin D.  I favor methotrexate given as a 5-day regimen alternating every other week.  If alternatively her CT imaging shows significant metastatic disease and her WHO score results in her having high risk disease would recommend a multi agent chemotherapy programs such as EMA-CO. given her history and the low hCG measures I have a low  suspicion that this will be necessary.  I spent some time with Jasmine Watts and her husband counseling her about the risk for recurrence of molar pregnancy and the need for a period of surveillance and abstinence from pregnancy for at least 6 months while we monitor her hCG levels after resolution of this issue.  I also discussed setting her future pregnancies she would require a first trimester confirmatory ultrasound scan, followed by pathology evaluation of her placenta at delivery.  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  The patient's history began in August 2018 when she conceived intentionally with a highly desired pregnancy.  HCG level on April 27, 2017 was [phone] and transvaginal ultrasound failed to show viable intrauterine pregnancy.  Repeated hCG on 04/30/2017 was 40,200.  She was taken to the operating room for suction D&C for a presumed molar pregnancy versus nonviable IUP on 05/11/2017.  The final pathology from this suction D&C revealed chorionic villi with features of molar gestation.  It was consistent with a complete molar gestation. The evacuation was felt to be incomplete, and therefore she returned to the OR for D&C on 05/18/17 for repeat D&C which again confirmed chorionic villi consistent with complete mole.   HCG levels:  Postoperative surveillance hCG on 05/24/2017 was 7582. 05/30/17: 1388 06/06/17: 504 06/13/17: 448 06/20/17: 436 07/03/17: 344 07/10/17: 391  Due to the plateauing/rising levels of  hCG she was taken back to the operating room for a repeat D&C on 07/23/2017 which revealed benign inactive endometrium.  07/27/17: 157 08/03/17: 126 08/09/17: 131 08/20/17: 118.  Interval History: She has no vaginal bleeding and no abdominal pain.  The patient is Rh negative and received Rhogam after her D&C procedures. She is otherwise very healthy with no major medical problems. She  desires future fertility. She is currently using nexplanon to prevent pregnancy.  Current Meds:  Outpatient Encounter Medications as of 08/29/2017  Medication Sig  . diphenhydrAMINE (BENADRYL) 25 mg capsule Take 25 mg by mouth every 6 (six) hours as needed (for allergies.).  Marland Kitchen ibuprofen (ADVIL,MOTRIN) 800 MG tablet Take 1 tablet (800 mg total) by mouth every 6 (six) hours as needed. (Patient not taking: Reported on 07/19/2017)  . Polyethyl Glycol-Propyl Glycol (LUBRICANT EYE DROPS) 0.4-0.3 % SOLN Place 1-2 drops into both eyes 3 (three) times daily as needed (for dry eyes.).  Marland Kitchen Prenatal Vit-Fe Fumarate-FA (MULTIVITAMIN-PRENATAL) 27-0.8 MG TABS tablet Take 1 tablet by mouth daily.   No facility-administered encounter medications on file as of 08/29/2017.     Allergy: No Known Allergies  Social Hx:   Social History   Socioeconomic History  . Marital status: Married    Spouse name: Not on file  . Number of children: Not on file  . Years of education: Not on file  . Highest education level: Not on file  Social Needs  . Financial resource strain: Not on file  . Food insecurity - worry: Not on file  . Food insecurity - inability: Not on file  . Transportation needs - medical: Not on file  . Transportation needs - non-medical: Not on file  Occupational History  . Not on file  Tobacco Use  . Smoking status: Never Smoker  . Smokeless tobacco: Never Used  Substance and Sexual Activity  . Alcohol use: Yes    Alcohol/week: 6.0 oz    Types: 10 Glasses of wine per week  . Drug use: No  . Sexual activity: Yes    Birth control/protection: None    Comment: retained products after D&E in 04/2017  Other Topics Concern  . Not on file  Social History Narrative  . Not on file    Past Surgical Hx:  Past Surgical History:  Procedure Laterality Date  . DILATION AND EVACUATION N/A 05/18/2017   Procedure: DILATATION AND EVACUATION under ULTRASOUND GUIDANCE;  Surgeon: Jerelyn Charles, MD;   Location: [facility];  Service: Gynecology;  Laterality: N/A;  . DILATION AND EVACUATION N/A 07/23/2017   Procedure: DILATATION AND EVACUATION;  Surgeon: Jerelyn Charles, MD;  Location: [facility];  Service: Gynecology;  Laterality: N/A;  ultrasound guidance   . HYSTEROSCOPY N/A 05/18/2017   Procedure: HYSTEROSCOPY;  Surgeon: Jerelyn Charles, MD;  Location: [facility];  Service: Gynecology;  Laterality: N/A;  . WISDOM TOOTH EXTRACTION      Past Medical Hx:  Past Medical History:  Diagnosis Date  . Medical history non-contributory     Past Gynecological History:  Molar pregancy (complete) August, 2018. No LMP recorded.  Family Hx: History reviewed. No pertinent family history.  Review of Systems:  Constitutional  Feels well,    ENT Normal appearing ears and nares bilaterally Skin/Breast  No rash, sores, jaundice, itching, dryness Cardiovascular  No chest pain, shortness of breath, or edema  Pulmonary  No cough or wheeze.  Gastro Intestinal  No nausea, vomitting, or diarrhoea. No bright red blood per rectum, no abdominal pain,  change in bowel movement, or constipation.  Genito Urinary  No frequency, urgency, dysuria, no bleeding Musculo Skeletal  No myalgia, arthralgia, joint swelling or pain  Neurologic  No weakness, numbness, change in gait,  Psychology  No depression, anxiety, insomnia.   Vitals:  Blood pressure 114/70, pulse 78, temperature 97.8 F (36.6 C), temperature source Oral, resp. rate 18, height 5\' 5"  (1.651 m), weight 127 lb 8 oz (57.8 kg), SpO2 100 %.  Physical Exam: WD in NAD Neck  Supple NROM, without any enlargements.  Lymph Node Survey No cervical supraclavicular or inguinal adenopathy Cardiovascular  Pulse normal rate, regularity and rhythm. S1 and S2 normal.  Lungs  Clear to auscultation bilateraly, without wheezes/crackles/rhonchi. Good air movement.  Skin  No rash/lesions/breakdown  Psychiatry  Alert and oriented to person, place, and time  Abdomen   Normoactive bowel sounds, abdomen soft, non-tender and thin without evidence of hernia.  Back No CVA tenderness Genito Urinary  Vulva/vagina: Normal external female genitalia.   No lesions. No discharge or bleeding.  Bladder/urethra:  No lesions or masses, well supported bladder  Vagina: no lesions identified  Cervix: Normal appearing, no lesions.  Uterus:  Small, mobile, no parametrial involvement or nodularity.  Adnexa: no palpable masses. Rectal  Good tone, no masses no cul de sac nodularity.  Extremities  No bilateral cyanosis, clubbing or edema.   Donaciano Eva, MD  08/29/2017, 5:23 PM

## 2017-08-29 NOTE — Patient Instructions (Signed)
Plan on having an HCG level drawn today.  Plan on having a CT scan of the chest, abdomen, and pelvis and we will contact you with the results.  You will have weekly HCG levels drawn and we will inform you of the results and any recommendations from Dr. Denman George if any.  Please call for any questions or concerns.

## 2017-08-30 ENCOUNTER — Telehealth: Payer: Self-pay | Admitting: *Deleted

## 2017-08-30 LAB — BETA HCG QUANT (REF LAB): hCG Quant: 77 m[IU]/mL

## 2017-08-30 NOTE — Telephone Encounter (Signed)
Called and gave the patient the date/time of the CT scans on December 12th at [place] patient of instructions to arrive at 2:45pm, and drink the first bottle on contrast at 1pm then the other at 2pm. Also advised the patient that we moved her lab appt that morning to the afternoon at 2pm.

## 2017-09-04 ENCOUNTER — Telehealth: Payer: Self-pay | Admitting: Gynecologic Oncology

## 2017-09-04 NOTE — Telephone Encounter (Signed)
Returned call to patient.  She had called and stated since her HCG with Dr. Ainsley Spinner office was in the 33s, could she cancel her CT for now.  Advised of her value here of 77.  She would like to have another lab draw tomorrow and see where her numbers are in comparison to last week.  Advised we would contact her with the value.  She would like to cancel her CT appt at this time.  She is advised to call for any needs.

## 2017-09-05 ENCOUNTER — Ambulatory Visit (HOSPITAL_COMMUNITY): Payer: 59

## 2017-09-05 ENCOUNTER — Other Ambulatory Visit (HOSPITAL_BASED_OUTPATIENT_CLINIC_OR_DEPARTMENT_OTHER): Payer: 59

## 2017-09-05 ENCOUNTER — Other Ambulatory Visit: Payer: 59

## 2017-09-05 DIAGNOSIS — E349 Endocrine disorder, unspecified: Secondary | ICD-10-CM | POA: Diagnosis not present

## 2017-09-05 DIAGNOSIS — O020 Blighted ovum and nonhydatidiform mole: Secondary | ICD-10-CM

## 2017-09-05 DIAGNOSIS — O019 Hydatidiform mole, unspecified: Secondary | ICD-10-CM

## 2017-09-06 ENCOUNTER — Telehealth: Payer: Self-pay | Admitting: Gynecologic Oncology

## 2017-09-06 LAB — BETA HCG QUANT (REF LAB): HCG QUANT: 39 m[IU]/mL

## 2017-09-06 NOTE — Telephone Encounter (Signed)
Patient returned call. Informed of HCG levels.  Advised she would be contacted about whether she needed CT scan once Dr. Denman George has reviewed.

## 2017-09-06 NOTE — Telephone Encounter (Signed)
Left message for patient asking patient to please call office to inform her of HCG level.

## 2017-09-10 ENCOUNTER — Telehealth: Payer: Self-pay | Admitting: Gynecologic Oncology

## 2017-09-10 NOTE — Telephone Encounter (Signed)
Called patient.  Informed patient that she can hold off on having CT scan at this time since her HCG levels are continuing to fall.  Advised if they plateau, it may be reconsidered.  Advised to continue with weekly HCG draws.  Advised to call for any needs.

## 2017-09-12 ENCOUNTER — Telehealth: Payer: Self-pay | Admitting: *Deleted

## 2017-09-12 ENCOUNTER — Other Ambulatory Visit (HOSPITAL_BASED_OUTPATIENT_CLINIC_OR_DEPARTMENT_OTHER): Payer: 59

## 2017-09-12 DIAGNOSIS — O020 Blighted ovum and nonhydatidiform mole: Secondary | ICD-10-CM | POA: Diagnosis not present

## 2017-09-12 DIAGNOSIS — O019 Hydatidiform mole, unspecified: Secondary | ICD-10-CM

## 2017-09-12 DIAGNOSIS — E349 Endocrine disorder, unspecified: Secondary | ICD-10-CM | POA: Diagnosis not present

## 2017-09-12 NOTE — Telephone Encounter (Signed)
Per request faxed records to [facility]

## 2017-09-13 LAB — BETA HCG QUANT (REF LAB): HCG QUANT: 33 m[IU]/mL

## 2017-09-14 ENCOUNTER — Encounter (INDEPENDENT_AMBULATORY_CARE_PROVIDER_SITE_OTHER): Payer: Self-pay

## 2017-09-20 ENCOUNTER — Other Ambulatory Visit: Payer: 59

## 2017-09-26 ENCOUNTER — Other Ambulatory Visit (HOSPITAL_BASED_OUTPATIENT_CLINIC_OR_DEPARTMENT_OTHER): Payer: 59

## 2017-09-26 DIAGNOSIS — E349 Endocrine disorder, unspecified: Secondary | ICD-10-CM

## 2017-09-26 DIAGNOSIS — O020 Blighted ovum and nonhydatidiform mole: Secondary | ICD-10-CM | POA: Diagnosis not present

## 2017-09-26 DIAGNOSIS — O019 Hydatidiform mole, unspecified: Secondary | ICD-10-CM

## 2017-09-27 ENCOUNTER — Encounter: Payer: Self-pay | Admitting: Gynecologic Oncology

## 2017-09-27 ENCOUNTER — Encounter (INDEPENDENT_AMBULATORY_CARE_PROVIDER_SITE_OTHER): Payer: Self-pay

## 2017-09-27 LAB — BETA HCG QUANT (REF LAB): HCG QUANT: 35 m[IU]/mL

## 2017-10-03 ENCOUNTER — Inpatient Hospital Stay: Payer: 59 | Attending: Gynecologic Oncology

## 2017-10-03 DIAGNOSIS — O019 Hydatidiform mole, unspecified: Secondary | ICD-10-CM

## 2017-10-03 DIAGNOSIS — E349 Endocrine disorder, unspecified: Secondary | ICD-10-CM

## 2017-10-03 DIAGNOSIS — O020 Blighted ovum and nonhydatidiform mole: Secondary | ICD-10-CM | POA: Diagnosis present

## 2017-10-04 LAB — BETA HCG QUANT (REF LAB): hCG Quant: 24 m[IU]/mL

## 2017-10-08 ENCOUNTER — Encounter (INDEPENDENT_AMBULATORY_CARE_PROVIDER_SITE_OTHER): Payer: Self-pay

## 2017-10-09 ENCOUNTER — Encounter: Payer: Self-pay | Admitting: Gynecologic Oncology

## 2017-10-10 ENCOUNTER — Inpatient Hospital Stay: Payer: 59

## 2017-10-10 DIAGNOSIS — O020 Blighted ovum and nonhydatidiform mole: Secondary | ICD-10-CM

## 2017-10-10 DIAGNOSIS — E349 Endocrine disorder, unspecified: Secondary | ICD-10-CM

## 2017-10-10 DIAGNOSIS — O019 Hydatidiform mole, unspecified: Secondary | ICD-10-CM

## 2017-10-11 ENCOUNTER — Telehealth: Payer: Self-pay | Admitting: *Deleted

## 2017-10-11 ENCOUNTER — Encounter (INDEPENDENT_AMBULATORY_CARE_PROVIDER_SITE_OTHER): Payer: Self-pay

## 2017-10-11 LAB — BETA HCG QUANT (REF LAB): hCG Quant: 3 m[IU]/mL

## 2017-10-11 NOTE — Telephone Encounter (Signed)
Patient returned call and was scheduled for weekly labs.

## 2017-10-17 ENCOUNTER — Inpatient Hospital Stay: Payer: 59

## 2017-10-17 DIAGNOSIS — O020 Blighted ovum and nonhydatidiform mole: Secondary | ICD-10-CM | POA: Diagnosis not present

## 2017-10-17 DIAGNOSIS — O019 Hydatidiform mole, unspecified: Secondary | ICD-10-CM

## 2017-10-17 DIAGNOSIS — E349 Endocrine disorder, unspecified: Secondary | ICD-10-CM

## 2017-10-18 ENCOUNTER — Encounter (INDEPENDENT_AMBULATORY_CARE_PROVIDER_SITE_OTHER): Payer: Self-pay

## 2017-10-18 LAB — BETA HCG QUANT (REF LAB): hCG Quant: 3 m[IU]/mL

## 2017-10-24 ENCOUNTER — Ambulatory Visit: Payer: 59 | Admitting: Gynecologic Oncology

## 2017-10-24 ENCOUNTER — Inpatient Hospital Stay: Payer: 59

## 2017-10-24 DIAGNOSIS — O020 Blighted ovum and nonhydatidiform mole: Secondary | ICD-10-CM | POA: Diagnosis not present

## 2017-10-24 DIAGNOSIS — O019 Hydatidiform mole, unspecified: Secondary | ICD-10-CM

## 2017-10-24 DIAGNOSIS — E349 Endocrine disorder, unspecified: Secondary | ICD-10-CM

## 2017-10-25 ENCOUNTER — Encounter: Payer: Self-pay | Admitting: Gynecologic Oncology

## 2017-10-25 LAB — BETA HCG QUANT (REF LAB): hCG Quant: 1 m[IU]/mL

## 2017-10-31 ENCOUNTER — Other Ambulatory Visit: Payer: 59

## 2017-11-07 ENCOUNTER — Other Ambulatory Visit: Payer: 59

## 2017-11-14 ENCOUNTER — Other Ambulatory Visit: Payer: 59

## 2017-11-21 ENCOUNTER — Other Ambulatory Visit: Payer: 59

## 2018-02-12 IMAGING — CR DG CHEST 2V
2 series · 2 of 2 positions shown · non-contrast
Comparison: None

CLINICAL DATA: Preop chest x-ray.  Molar pregnancy

EXAM:
CHEST  2 VIEW

[chest pa]
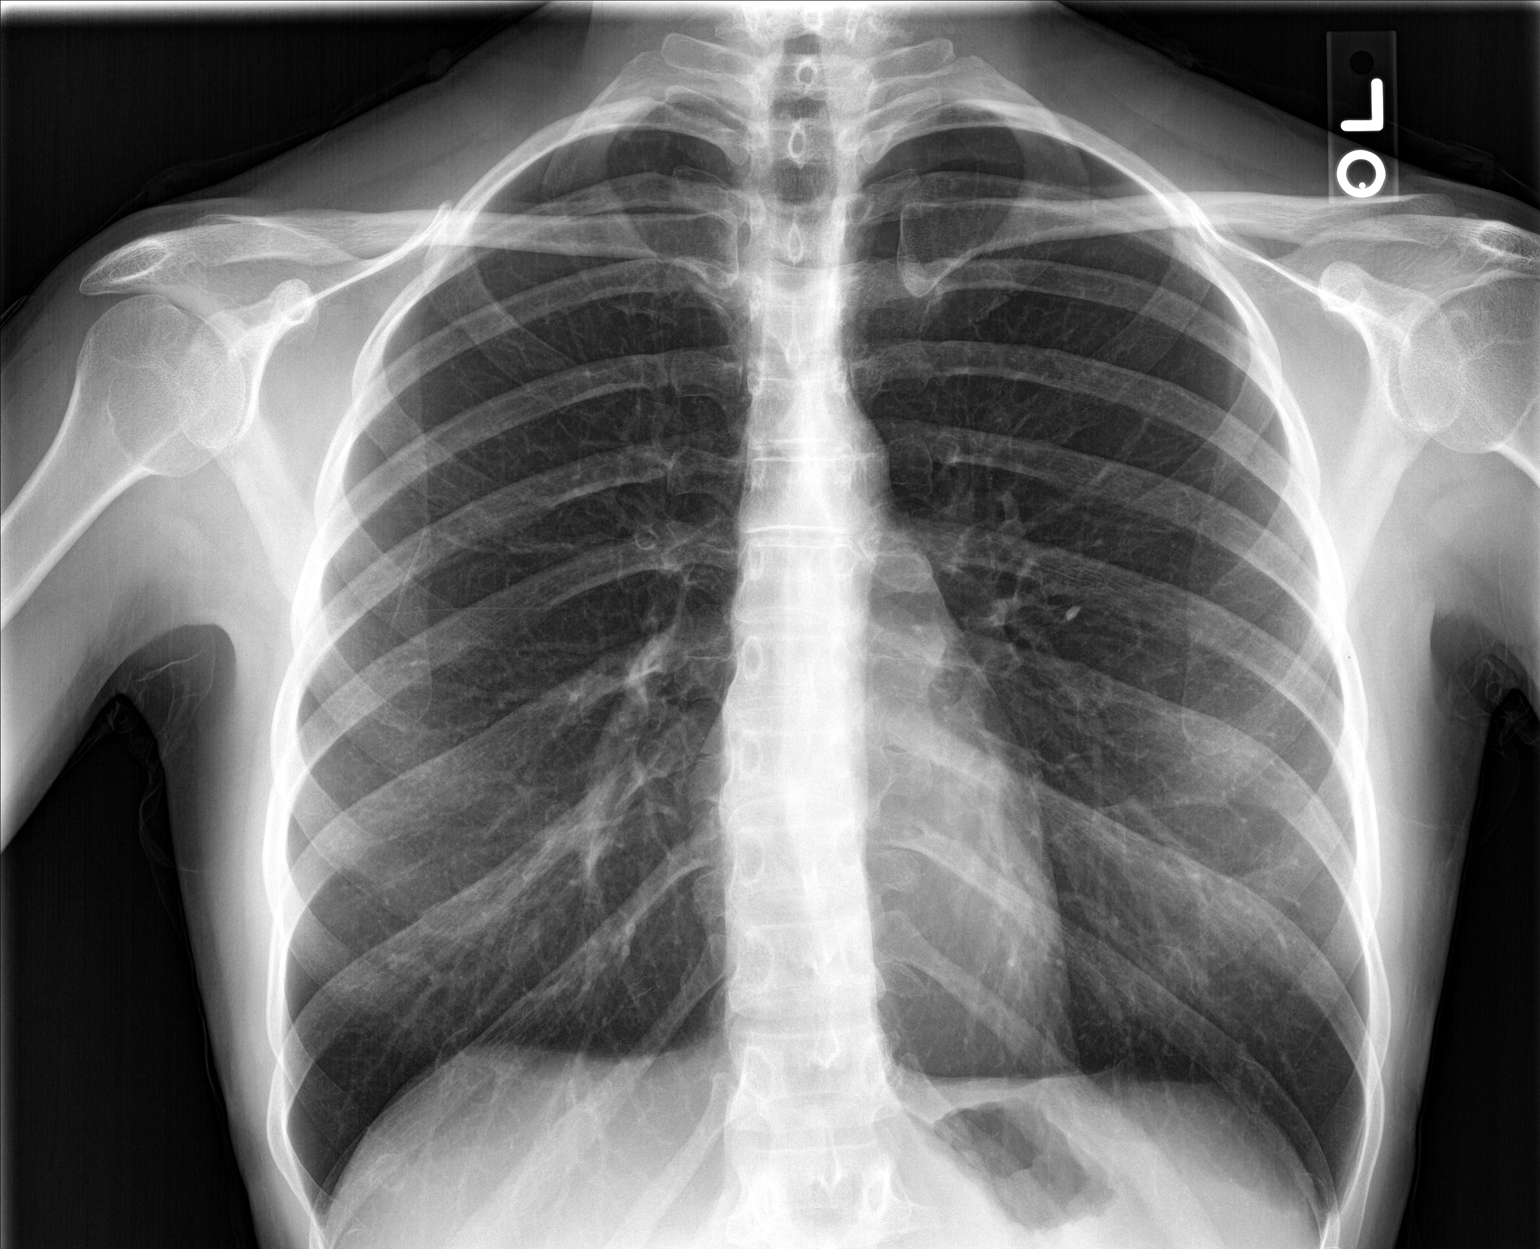

[chest lat]
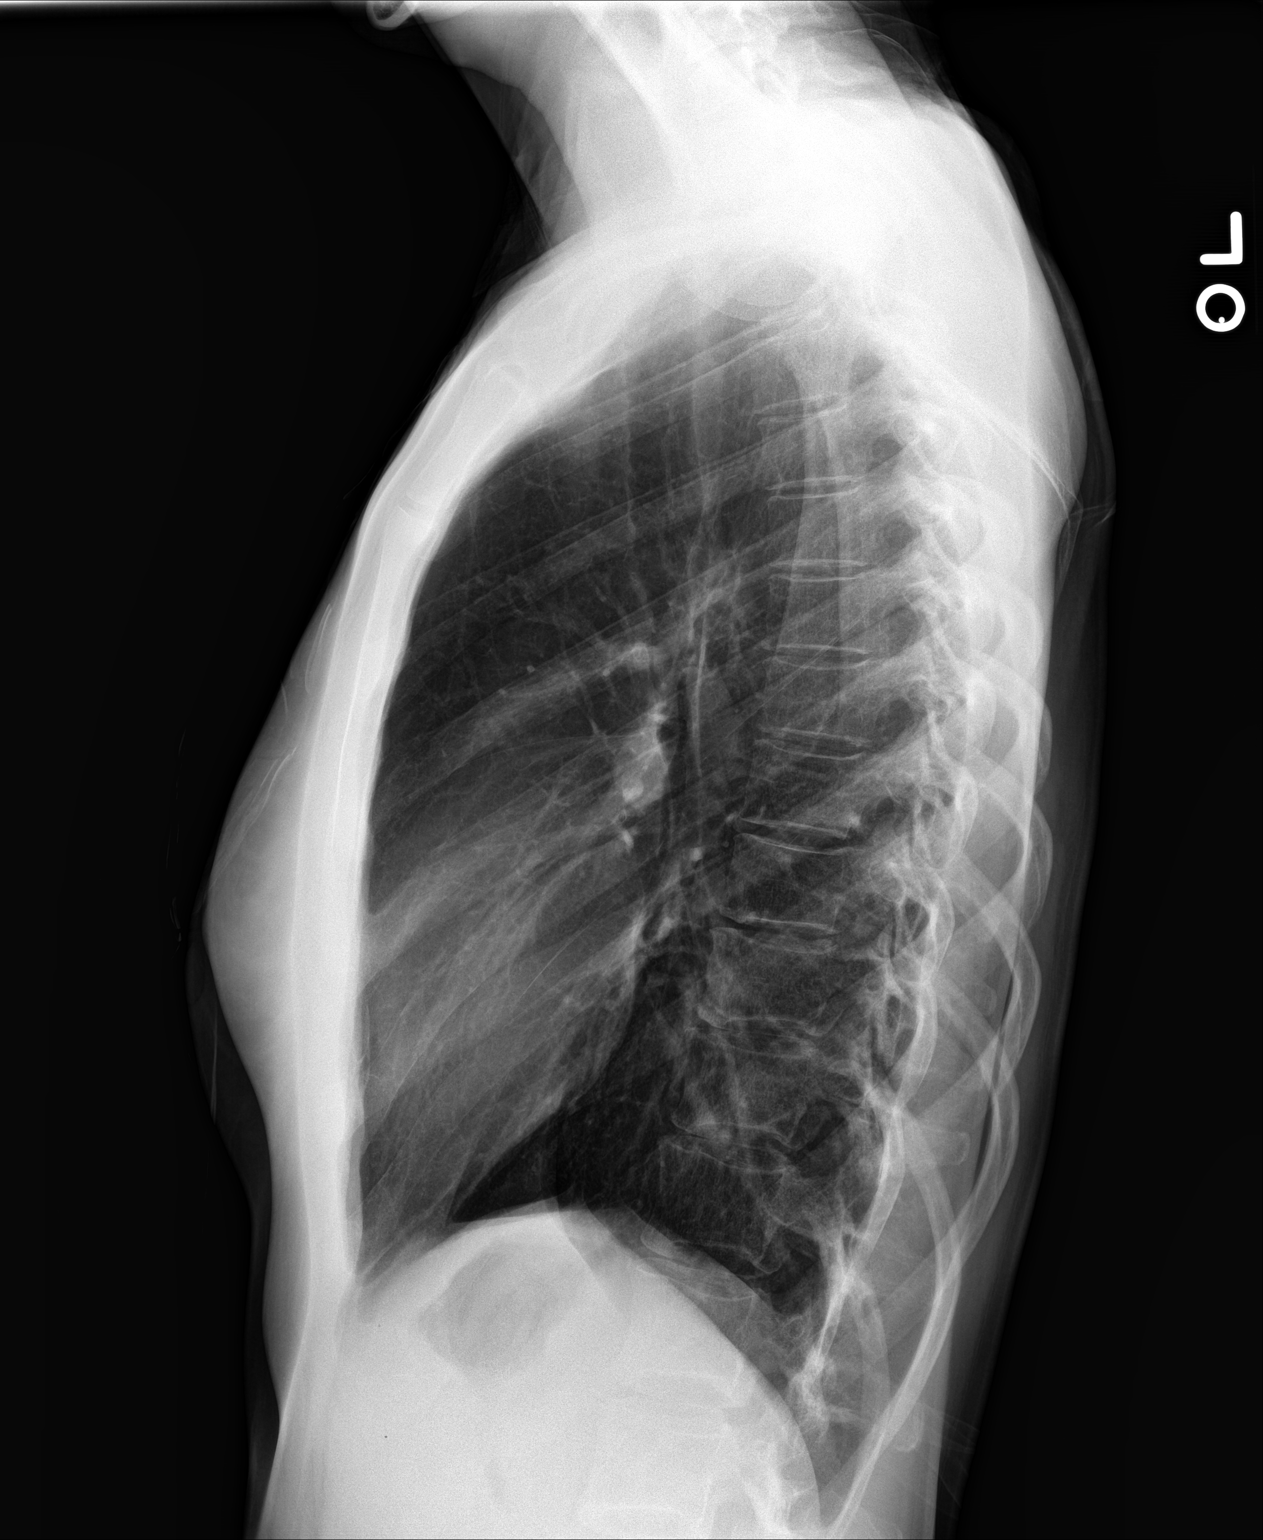

[2 of 2 positions shown; findings below may reference images not displayed]

FINDINGS: Heart and mediastinal contours are within normal limits. No focal
opacities or effusions. No acute bony abnormality.
IMPRESSION: No active cardiopulmonary disease.

## 2018-10-30 ENCOUNTER — Encounter: Payer: Self-pay | Admitting: Gastroenterology

## 2018-11-20 ENCOUNTER — Ambulatory Visit (INDEPENDENT_AMBULATORY_CARE_PROVIDER_SITE_OTHER): Payer: BLUE CROSS/BLUE SHIELD | Admitting: Gastroenterology

## 2018-11-20 ENCOUNTER — Encounter: Payer: Self-pay | Admitting: Gastroenterology

## 2018-11-20 ENCOUNTER — Other Ambulatory Visit (INDEPENDENT_AMBULATORY_CARE_PROVIDER_SITE_OTHER): Payer: BLUE CROSS/BLUE SHIELD

## 2018-11-20 VITALS — BP 100/60 | HR 80 | Ht 64.57 in | Wt 118.5 lb

## 2018-11-20 DIAGNOSIS — R142 Eructation: Secondary | ICD-10-CM | POA: Diagnosis not present

## 2018-11-20 DIAGNOSIS — R140 Abdominal distension (gaseous): Secondary | ICD-10-CM

## 2018-11-20 LAB — H. PYLORI ANTIBODY, IGG: H Pylori IgG: NEGATIVE

## 2018-11-20 LAB — IGA: IgA: 203 mg/dL (ref 68–378)

## 2018-11-20 MED ORDER — OMEPRAZOLE 40 MG PO CPDR: 40.0000 mg | DELAYED_RELEASE_CAPSULE | Freq: Every day | ORAL | 3 refills | Status: DC

## 2018-11-20 NOTE — Patient Instructions (Addendum)
Thank you for coming to see me today!  The UpToDate website has useful information belching and gas. I recommend that you check it out.  I am recommending labs today for H pylori and celiac.  We will try omeprazole at a prescription dose strength for 8 weeks.  Please call me if that is not helpful and we can proceed with testing and/or treatment for bacterial overgrowth.   Please find a primary care provider. I highly recommend Dr. Letta Median with the [facility].    We have sent the following medications to your pharmacy for you to pick up at your convenience:  Omeprazole   Your provider has requested that you go to the basement level for lab work before leaving today. Press "B" on the elevator. The lab is located at the first door on the left as you exit the elevator.   Thank you for choosing me and [facility].  Dr. Tarri Glenn

## 2018-11-20 NOTE — Progress Notes (Signed)
Referring Provider: No ref. provider found Primary Care Physician:  Patient, No Pcp Per   Reason for Consultation: Bloating and eructation   IMPRESSION:  Belching without alarm features (weight loss, abdominal pain, dysphagia, heartburn, and regurgitation   Differential: GERD, functional dyspepsia, supragastric belching, SIBO  I've recommended efforts to decrease air swallowing and provided reassurance that belching is a benign condition. I've recommended the discontinuation of gum chewing, smoking, drinking carbonated beverages, and gulping foods and liquids. Any underlying anxiety or depression should be treated.   A trial of PPI is recommended for management of possible reflux. If no response to empiric treatment, will consider 24 hour pH/impedence testing to evaluate for supragastric belching and air swallowing.   Other treatment options to consider in the future include: a trial of FDGuard, therapy (cognitive behavioral therapist or speech therapist) with special training in diaphragmatic breathing techniques and/or baclofen 10 mg TID which reduces transient lower esophageal sphincter relaxations and centrally suppressing the swallowing rate, possibly decreasing both supragastric and gastric belching. Will use as a last resort given the potential side effects.   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 The history is obtained through the patient.  Review of EPIC and CareEverywhere reveals no prior abdominal imaging or  endoscopic evaluation.  She had childhood asthma.  She has had anxiety for a couple of years.  She reports a 2-year history of belching.  She will experience an episode of eructation anywhere from every 15 minutes to every 3 days.  No associated bloating or distention.  Belching occurs within 2 to 30 minutes after eating.  Triggered by certain foods: sugar, spinach, deli meats, onion powder, garlic powder, frozen fries. Sometimes she feels like a bump gets stuck her xiphoid process and she has to stretch out. When its most severe she is unable to sit or be still. No change in bowel habits. Belching seems to be exacerbated by stress. No associated abdominal pain, change in stool form or frequency, constipation, incomplete evacuation, diarrhea, weight loss. No prior abdominal surgeries.   She thought maybe it was gastritis so she has tried to adjust her diet without significant improvement.  She is tried OTC Prilosec 20 mg daily x 2 weeks without any significant change. Garlic powder extract for 3 weeks. Using a probiotic for the last 3 weeks.  Researching the Internet she has removed coffee and carbonated beverages from her diet.  She also gave up using straws. No weight loss, abdominal pain, dysphagia, heartburn, or regurgitation. No other associated symptoms. No identified exacerbating or relieving features.    He has not tried a gluten-free or lactose-free diet.  She denies significant consumption of legumes, onions, cabbage, Brussels sprouts.  She does not eat artificial sweeteners.  Seen by a gastroenterologist at [facility] for these same symptoms over a year ago. Was told it was normal and sent her home without testing or somatic recommendations.   Sister with similar symptoms and Cymbalta improved her GI symptoms.    There is no known family history of colon cancer or polyps.   Past Medical History:  Diagnosis Date  . Anxiety   .  Asthma     Past Surgical History:  Procedure Laterality Date    . DILATION AND EVACUATION N/A 05/18/2017   Procedure: DILATATION AND EVACUATION under ULTRASOUND GUIDANCE;  Surgeon: Jerelyn Charles, MD;  Location: [facility];  Service: Gynecology;  Laterality: N/A;  . DILATION AND EVACUATION N/A 07/23/2017   Procedure: DILATATION AND EVACUATION;  Surgeon: Jerelyn Charles, MD;  Location: [facility];  Service: Gynecology;  Laterality: N/A;  ultrasound guidance   . HYSTEROSCOPY N/A 05/18/2017   Procedure: HYSTEROSCOPY;  Surgeon: Jerelyn Charles, MD;  Location: [facility];  Service: Gynecology;  Laterality: N/A;  . WISDOM TOOTH EXTRACTION      Current Outpatient Medications  Medication Sig Dispense Refill  . GARLIC PO Take 1 tablet by mouth daily.    . Probiotic Product (PROBIOTIC PO) Take 1 tablet by mouth daily.    Marland Kitchen omeprazole (PRILOSEC) 40 MG capsule Take 1 capsule (40 mg total) by mouth daily. 90 capsule 3   No current facility-administered medications for this visit.     Allergies as of 11/20/2018  . (No Known Allergies)    Family History  Problem Relation Age of Onset  . Skin cancer Father   . Hypertension Sister     Social History   Socioeconomic History  . Marital status: Married    Spouse name: Not on file  . Number of children: 0  . Years of education: Not on file  . Highest education level: Not on file  Occupational History  . Occupation: Psychologist, sport and exercise  Social Needs  . Financial resource strain: Not on file  . Food insecurity:    Worry: Not on file    Inability: Not on file  . Transportation needs:    Medical: Not on file    Non-medical: Not on file  Tobacco Use  . Smoking status: Never Smoker  . Smokeless tobacco: Never Used  Substance and Sexual Activity  . Alcohol use: Not Currently  . Drug use: No  . Sexual activity: Yes    Birth control/protection: None    Comment: retained products after D&E in 04/2017  Lifestyle  . Physical activity:    Days per week: Not on file    Minutes per session: Not on file  . Stress: Not on file   Relationships  . Social connections:    Talks on phone: Not on file    Gets together: Not on file    Attends religious service: Not on file    Active member of club or organization: Not on file    Attends meetings of clubs or organizations: Not on file    Relationship status: Not on file  . Intimate partner violence:    Fear of current or ex partner: Not on file    Emotionally abused: Not on file    Physically abused: Not on file    Forced sexual activity: Not on file  Other Topics Concern  . Not on file  Social History Narrative  . Not on file    Review of Systems: 12 system ROS is negative except as noted above except for anxiety.  Filed Weights   11/20/18 0824  Weight: 118 lb 8 oz (53.8 kg)    Physical Exam: Vital signs were reviewed. General:   Alert, well-nourished, pleasant and cooperative in NAD.  No belching occurred during my evaluation. Head:  Normocephalic and atraumatic. Eyes:  Sclera clear, no icterus.   Conjunctiva pink. Mouth:  No deformity or lesions.   Neck:  Supple; no  thyromegaly. Lungs:  Clear throughout to auscultation.   No wheezes. Heart:  Regular rate and rhythm; no murmurs Abdomen:  Soft, nontender, normal bowel sounds. No rebound or guarding. No hepatosplenomegaly Rectal:  Deferred  Msk:  Symmetrical without gross deformities. Extremities:  No gross deformities or edema. Neurologic:  Alert and  oriented x4;  grossly nonfocal Skin:  No rash or bruise. Psych:  Alert and cooperative. Normal mood and affect.   Valta Dillon L. Tarri Glenn, MD, MPH [facility] 11/21/2018, 4:20 PM

## 2018-11-21 LAB — TISSUE TRANSGLUTAMINASE, IGA: (tTG) Ab, IgA: 1 U/mL

## 2019-01-29 ENCOUNTER — Other Ambulatory Visit: Payer: Self-pay | Admitting: *Deleted

## 2019-01-29 MED ORDER — OMEPRAZOLE 40 MG PO CPDR: 40.0000 mg | DELAYED_RELEASE_CAPSULE | Freq: Two times a day (BID) | ORAL | 1 refills | Status: DC

## 2019-02-21 ENCOUNTER — Ambulatory Visit: Payer: BLUE CROSS/BLUE SHIELD | Admitting: Gastroenterology

## 2019-03-17 NOTE — Telephone Encounter (Signed)
Spoke with patient and informed her of Dr. Tarri Glenn message. She verbalized understanding.

## 2019-04-10 ENCOUNTER — Other Ambulatory Visit: Payer: Self-pay | Admitting: Gastroenterology

## 2019-09-05 ENCOUNTER — Other Ambulatory Visit: Payer: Self-pay | Admitting: Gastroenterology

## 2019-11-02 ENCOUNTER — Other Ambulatory Visit: Payer: Self-pay | Admitting: Gastroenterology

## 2019-11-02 NOTE — Telephone Encounter (Signed)
This patient needs an annual visit. May have a refill until that time. Could be a virtual visit. Thanks.

## 2019-12-31 ENCOUNTER — Other Ambulatory Visit: Payer: Self-pay | Admitting: Gastroenterology

## 2023-10-18 ENCOUNTER — Encounter (HOSPITAL_BASED_OUTPATIENT_CLINIC_OR_DEPARTMENT_OTHER): Payer: Self-pay | Admitting: Certified Nurse Midwife

## 2023-10-18 ENCOUNTER — Ambulatory Visit (HOSPITAL_BASED_OUTPATIENT_CLINIC_OR_DEPARTMENT_OTHER): Payer: Medicaid Other | Admitting: Certified Nurse Midwife

## 2023-10-18 VITALS — BP 111/77 | HR 75 | Ht 64.57 in | Wt 123.6 lb

## 2023-10-18 DIAGNOSIS — Z30011 Encounter for initial prescription of contraceptive pills: Secondary | ICD-10-CM | POA: Diagnosis not present

## 2023-10-18 DIAGNOSIS — Z1231 Encounter for screening mammogram for malignant neoplasm of breast: Secondary | ICD-10-CM | POA: Diagnosis not present

## 2023-10-18 MED ORDER — TYBLUME 0.1-20 MG-MCG PO CHEW: 1.0000 | CHEWABLE_TABLET | Freq: Every day | ORAL | 12 refills | Status: DC

## 2023-10-18 NOTE — Progress Notes (Signed)
  GYNECOLOGY  VISIT  CC:   birth control pill  HPI: 43 y.o. G29P0010 Married White or Caucasian female here for initiation of birth control pills. She is in a new relationship and would like to prevent pregnancy at this time. She may be open to pregnancy in the future. States she bought a birth control pill online and has been taking it for a month. She is doing well and has no c/o. Reports periods are monthly and regular with no dysmenorrhea or menorrhagia. Declines HPV Vaccines. Declines STI screening.   Past Medical History:  Diagnosis Date   Anxiety    Asthma     MEDS:   No current outpatient medications on file prior to visit.   No current facility-administered medications on file prior to visit.    ALLERGIES: Patient has no known allergies.  SH:  works for [employer]  Review of Systems  Constitutional: Negative.   Eyes: Negative.   Genitourinary: Negative.   All other systems reviewed and are negative.  PHYSICAL EXAMINATION:    BP 111/77 (BP Location: Right Arm, Patient Position: Sitting)   Pulse 75   Ht 5' 4.57" (1.64 m)   Wt 123 lb 9.6 oz (56.1 kg)   LMP 10/11/2023   BMI 20.84 kg/m     General appearance: alert, cooperative and appears stated age   Assessment/Plan: 1. Encounter for screening mammogram for malignant neoplasm of breast (Primary) - Continue annual screening mammograms (pt states UTD at this time) - MM 3D SCREENING MAMMOGRAM BILATERAL BREAST; Future  2. Encounter for initial prescription of contraceptive pills - Pt aware of options for contraception and desires COC at this time. No contraindications.  - Levonorgestrel-Ethinyl Estrad (TYBLUME) 0.1-20 MG-MCG CHEW; Chew 1 each by mouth daily.  Dispense: 30 tablet; Refill: 12  Pt declines Gardasil HPV Vaccines. Pt states last pap 2024 and Negative. Pap not due at this time.  RTO 1 year for annual gyn exam and prn if issues arise. Letta Kocher

## 2024-08-07 ENCOUNTER — Encounter (HOSPITAL_BASED_OUTPATIENT_CLINIC_OR_DEPARTMENT_OTHER): Payer: Self-pay

## 2024-09-24 ENCOUNTER — Other Ambulatory Visit (HOSPITAL_BASED_OUTPATIENT_CLINIC_OR_DEPARTMENT_OTHER): Payer: Self-pay | Admitting: Certified Nurse Midwife

## 2024-09-24 DIAGNOSIS — Z30011 Encounter for initial prescription of contraceptive pills: Secondary | ICD-10-CM

## 2024-10-18 ENCOUNTER — Encounter (HOSPITAL_BASED_OUTPATIENT_CLINIC_OR_DEPARTMENT_OTHER): Payer: Self-pay | Admitting: Obstetrics & Gynecology

## 2024-10-20 ENCOUNTER — Ambulatory Visit (HOSPITAL_BASED_OUTPATIENT_CLINIC_OR_DEPARTMENT_OTHER): Payer: Medicaid Other | Admitting: Certified Nurse Midwife

## 2024-10-21 ENCOUNTER — Ambulatory Visit (HOSPITAL_BASED_OUTPATIENT_CLINIC_OR_DEPARTMENT_OTHER): Admitting: Obstetrics and Gynecology

## 2024-10-21 ENCOUNTER — Encounter (HOSPITAL_BASED_OUTPATIENT_CLINIC_OR_DEPARTMENT_OTHER): Payer: Self-pay

## 2024-10-22 ENCOUNTER — Other Ambulatory Visit (HOSPITAL_BASED_OUTPATIENT_CLINIC_OR_DEPARTMENT_OTHER): Payer: Self-pay | Admitting: Obstetrics & Gynecology

## 2024-10-22 DIAGNOSIS — Z30011 Encounter for initial prescription of contraceptive pills: Secondary | ICD-10-CM

## 2024-12-03 ENCOUNTER — Ambulatory Visit (HOSPITAL_BASED_OUTPATIENT_CLINIC_OR_DEPARTMENT_OTHER): Admitting: Physician Assistant
